# Patient Record
Sex: Female | Born: 1950 | Race: White | Hispanic: No | Marital: Married | Smoking: Current every day smoker
Health system: Southern US, Community
[De-identification: ages and names within clinical notes are randomized; demographics above are authoritative.]

## PROBLEM LIST (undated history)

## (undated) DIAGNOSIS — F329 Major depressive disorder, single episode, unspecified: Secondary | ICD-10-CM

## (undated) DIAGNOSIS — G709 Myoneural disorder, unspecified: Secondary | ICD-10-CM

## (undated) DIAGNOSIS — K219 Gastro-esophageal reflux disease without esophagitis: Secondary | ICD-10-CM

## (undated) DIAGNOSIS — R32 Unspecified urinary incontinence: Secondary | ICD-10-CM

## (undated) DIAGNOSIS — M1990 Unspecified osteoarthritis, unspecified site: Secondary | ICD-10-CM

## (undated) DIAGNOSIS — F419 Anxiety disorder, unspecified: Secondary | ICD-10-CM

## (undated) DIAGNOSIS — J449 Chronic obstructive pulmonary disease, unspecified: Secondary | ICD-10-CM

## (undated) DIAGNOSIS — K529 Noninfective gastroenteritis and colitis, unspecified: Secondary | ICD-10-CM

## (undated) DIAGNOSIS — F32A Depression, unspecified: Secondary | ICD-10-CM

## (undated) HISTORY — PX: FRACTURE SURGERY: SHX138

## (undated) HISTORY — PX: HERNIA REPAIR: SHX51

## (undated) HISTORY — PX: BACK SURGERY: SHX140

## (undated) HISTORY — PX: DIAGNOSTIC LAPAROSCOPY: SUR761

---

## 1971-01-04 HISTORY — PX: APPENDECTOMY: SHX54

## 1988-09-03 HISTORY — PX: KNEE ARTHROSCOPY: SUR90

## 1993-01-03 HISTORY — PX: KNEE SURGERY: SHX244

## 1998-01-03 HISTORY — PX: LAPAROSCOPY: SHX197

## 1999-08-04 HISTORY — PX: BACK SURGERY: SHX140

## 2001-06-03 HISTORY — PX: HEMORROIDECTOMY: SUR656

## 2001-06-03 HISTORY — PX: ABDOMINAL HYSTERECTOMY: SHX81

## 2004-09-03 HISTORY — PX: BACK SURGERY: SHX140

## 2010-05-04 HISTORY — PX: THYROID LOBECTOMY: SHX420

## 2010-05-04 HISTORY — PX: PARATHYROIDECTOMY: SHX19

## 2010-06-04 DIAGNOSIS — K529 Noninfective gastroenteritis and colitis, unspecified: Secondary | ICD-10-CM

## 2010-06-04 HISTORY — DX: Noninfective gastroenteritis and colitis, unspecified: K52.9

## 2014-03-13 ENCOUNTER — Other Ambulatory Visit: Payer: Self-pay | Admitting: Neurosurgery

## 2014-03-13 DIAGNOSIS — M48061 Spinal stenosis, lumbar region without neurogenic claudication: Secondary | ICD-10-CM

## 2014-03-18 ENCOUNTER — Ambulatory Visit
Admission: RE | Admit: 2014-03-18 | Discharge: 2014-03-18 | Disposition: A | Discharge status: Home / Self Care | Payer: Medicare HMO | Source: Ambulatory Visit | Attending: Neurosurgery | Admitting: Neurosurgery

## 2014-03-18 DIAGNOSIS — M48061 Spinal stenosis, lumbar region without neurogenic claudication: Secondary | ICD-10-CM

## 2014-03-18 MED ORDER — METHYLPREDNISOLONE ACETATE 40 MG/ML INJ SUSP (RADIOLOG
120.0000 mg | Freq: Once | INTRAMUSCULAR | Status: AC
  Administered 2014-03-18: 120 mg via EPIDURAL

## 2014-03-18 MED ORDER — IOHEXOL 180 MG/ML  SOLN
1.0000 mL | Freq: Once | INTRAMUSCULAR | Status: AC | PRN
  Administered 2014-03-18: 1 mL via EPIDURAL

## 2014-03-18 MED ORDER — DIAZEPAM 5 MG PO TABS
10.0000 mg | ORAL_TABLET | Freq: Once | ORAL | Status: AC
  Administered 2014-03-18: 10 mg via ORAL

## 2014-03-18 NOTE — Discharge Instructions (Signed)

## 2014-04-01 ENCOUNTER — Other Ambulatory Visit: Payer: Self-pay | Admitting: Neurosurgery

## 2014-04-01 DIAGNOSIS — M48061 Spinal stenosis, lumbar region without neurogenic claudication: Secondary | ICD-10-CM

## 2014-04-15 ENCOUNTER — Other Ambulatory Visit: Payer: Medicare HMO

## 2014-04-17 ENCOUNTER — Inpatient Hospital Stay: Admission: RE | Admit: 2014-04-17 | Payer: Medicare HMO | Source: Ambulatory Visit

## 2014-04-17 ENCOUNTER — Ambulatory Visit
Admission: RE | Admit: 2014-04-17 | Discharge: 2014-04-17 | Disposition: A | Discharge status: Home / Self Care | Payer: Medicare HMO | Source: Ambulatory Visit | Attending: Neurosurgery | Admitting: Neurosurgery

## 2014-04-17 DIAGNOSIS — M48061 Spinal stenosis, lumbar region without neurogenic claudication: Secondary | ICD-10-CM

## 2014-04-17 HISTORY — DX: Noninfective gastroenteritis and colitis, unspecified: K52.9

## 2014-04-17 MED ORDER — METHYLPREDNISOLONE ACETATE 40 MG/ML INJ SUSP (RADIOLOG
120.0000 mg | Freq: Once | INTRAMUSCULAR | Status: AC
  Administered 2014-04-17: 120 mg via EPIDURAL

## 2014-04-17 MED ORDER — IOHEXOL 180 MG/ML  SOLN
1.0000 mL | Freq: Once | INTRAMUSCULAR | Status: AC | PRN
  Administered 2014-04-17: 1 mL via EPIDURAL

## 2014-04-17 MED ORDER — DIAZEPAM 5 MG PO TABS
10.0000 mg | ORAL_TABLET | Freq: Once | ORAL | Status: AC
  Administered 2014-04-17: 10 mg via ORAL

## 2014-04-17 NOTE — Discharge Instructions (Signed)

## 2014-05-05 ENCOUNTER — Other Ambulatory Visit: Payer: Self-pay | Admitting: Neurosurgery

## 2014-05-05 DIAGNOSIS — M545 Low back pain, unspecified: Secondary | ICD-10-CM

## 2014-05-05 DIAGNOSIS — G8929 Other chronic pain: Secondary | ICD-10-CM

## 2014-05-14 ENCOUNTER — Ambulatory Visit
Admission: RE | Admit: 2014-05-14 | Discharge: 2014-05-14 | Disposition: A | Discharge status: Home / Self Care | Payer: Medicare HMO | Source: Ambulatory Visit | Attending: Neurosurgery | Admitting: Neurosurgery

## 2014-05-14 DIAGNOSIS — M545 Low back pain, unspecified: Secondary | ICD-10-CM

## 2014-05-14 DIAGNOSIS — G8929 Other chronic pain: Secondary | ICD-10-CM

## 2014-05-14 MED ORDER — DIAZEPAM 5 MG PO TABS
10.0000 mg | ORAL_TABLET | Freq: Once | ORAL | Status: AC
  Administered 2014-05-14: 10 mg via ORAL

## 2014-05-14 MED ORDER — METHYLPREDNISOLONE ACETATE 40 MG/ML INJ SUSP (RADIOLOG
120.0000 mg | Freq: Once | INTRAMUSCULAR | Status: AC
  Administered 2014-05-14: 120 mg via EPIDURAL

## 2014-05-14 MED ORDER — IOHEXOL 180 MG/ML  SOLN
1.0000 mL | Freq: Once | INTRAMUSCULAR | Status: AC | PRN
  Administered 2014-05-14: 1 mL via EPIDURAL

## 2014-05-14 NOTE — Discharge Instructions (Signed)

## 2014-07-02 DIAGNOSIS — R635 Abnormal weight gain: Secondary | ICD-10-CM | POA: Insufficient documentation

## 2014-07-02 DIAGNOSIS — E213 Hyperparathyroidism, unspecified: Secondary | ICD-10-CM | POA: Insufficient documentation

## 2014-07-02 DIAGNOSIS — Z720 Tobacco use: Secondary | ICD-10-CM | POA: Insufficient documentation

## 2014-07-02 DIAGNOSIS — E041 Nontoxic single thyroid nodule: Secondary | ICD-10-CM | POA: Insufficient documentation

## 2014-07-02 DIAGNOSIS — D8989 Other specified disorders involving the immune mechanism, not elsewhere classified: Secondary | ICD-10-CM | POA: Insufficient documentation

## 2014-07-02 DIAGNOSIS — R5382 Chronic fatigue, unspecified: Secondary | ICD-10-CM

## 2014-07-02 DIAGNOSIS — G9332 Myalgic encephalomyelitis/chronic fatigue syndrome: Secondary | ICD-10-CM | POA: Insufficient documentation

## 2014-08-11 ENCOUNTER — Other Ambulatory Visit: Payer: Self-pay | Admitting: Neurosurgery

## 2014-08-11 DIAGNOSIS — M48061 Spinal stenosis, lumbar region without neurogenic claudication: Secondary | ICD-10-CM

## 2014-08-22 ENCOUNTER — Ambulatory Visit
Admission: RE | Admit: 2014-08-22 | Discharge: 2014-08-22 | Disposition: A | Discharge status: Home / Self Care | Payer: Medicare HMO | Source: Ambulatory Visit | Attending: Neurosurgery | Admitting: Neurosurgery

## 2014-08-22 DIAGNOSIS — M48061 Spinal stenosis, lumbar region without neurogenic claudication: Secondary | ICD-10-CM

## 2014-08-22 MED ORDER — IOHEXOL 180 MG/ML  SOLN
15.0000 mL | Freq: Once | INTRAMUSCULAR | Status: DC | PRN
  Administered 2014-08-22: 15 mL via INTRATHECAL

## 2014-08-22 MED ORDER — DIAZEPAM 5 MG PO TABS
5.0000 mg | ORAL_TABLET | Freq: Once | ORAL | Status: AC
  Administered 2014-08-22: 5 mg via ORAL

## 2014-08-22 MED ORDER — MEPERIDINE HCL 100 MG/ML IJ SOLN
75.0000 mg | Freq: Once | INTRAMUSCULAR | Status: AC
  Administered 2014-08-22: 75 mg via INTRAMUSCULAR

## 2014-08-22 MED ORDER — OXYCODONE HCL 5 MG PO TABS
10.0000 mg | ORAL_TABLET | Freq: Once | ORAL | Status: AC
  Administered 2014-08-22: 10 mg via ORAL

## 2014-08-22 MED ORDER — ONDANSETRON HCL 4 MG/2ML IJ SOLN
4.0000 mg | Freq: Once | INTRAMUSCULAR | Status: AC
  Administered 2014-08-22: 4 mg via INTRAMUSCULAR

## 2014-08-22 NOTE — Progress Notes (Signed)
Pt states she has been off Phentermine and Promethazine for the past week. Discharge instructions explained to pt.

## 2014-08-22 NOTE — Discharge Instructions (Signed)
Myelogram Discharge Instructions  1. Go home and rest quietly for the next 24 hours.  It is important to lie flat for the next 24 hours.  Get up only to go to the restroom.  You may lie in the bed or on a couch on your back, your stomach, your left side or your right side.  You may have one pillow under your head.  You may have pillows between your knees while you are on your side or under your knees while you are on your back.  2. DO NOT drive today.  Recline the seat as far back as it will go, while still wearing your seat belt, on the way home.  3. You may get up to go to the bathroom as needed.  You may sit up for 10 minutes to eat.  You may resume your normal diet and medications unless otherwise indicated.  Drink lots of extra fluids today and tomorrow.  4. The incidence of headache, nausea, or vomiting is about 5% (one in 20 patients).  If you develop a headache, lie flat and drink plenty of fluids until the headache goes away.  Caffeinated beverages may be helpful.  If you develop severe nausea and vomiting or a headache that does not go away with flat bed rest, call [phone].  5. You may resume normal activities after your 24 hours of bed rest is over; however, do not exert yourself strongly or do any heavy lifting tomorrow. If when you get up you have a headache when standing, go back to bed and force fluids for another 24 hours.  6. Call your physician for a follow-up appointment.  The results of your myelogram will be sent directly to your physician by the following day.  7. If you have any questions or if complications develop after you arrive home, please call [phone].  Discharge instructions have been explained to the patient.  The patient, or the person responsible for the patient, fully understands these instructions.      May resume Phentermine and Promethazine on Aug. 20, 2016, after 11:00 am.

## 2014-09-01 ENCOUNTER — Telehealth: Payer: Self-pay

## 2014-09-01 NOTE — Telephone Encounter (Signed)
Patient called wanting to speak with Dr. Benard Rink in follow-up to lumbar myelogram here 08/22/14.  I told her Dr. Benard Rink wasn't in this office today and that Dr. Gerlene Fee really is the physician to speak with her about the results and treatment options.  She says her appointment with Dr. Gerlene Fee isn't until 09/10/14, and that she's hurting and wants to know what to do and what the results are of the myelogram.  I reiterated several times that she needed to get this information from Dr. Gerlene Fee; that Dr. Benard Rink isn't in a position to go over the myelogram results and discuss treatment options.  Donell Sievert, RN

## 2014-09-10 ENCOUNTER — Other Ambulatory Visit: Payer: Self-pay | Admitting: Neurosurgery

## 2014-09-25 ENCOUNTER — Ambulatory Visit (HOSPITAL_COMMUNITY)
Admission: RE | Admit: 2014-09-25 | Discharge: 2014-09-25 | Disposition: A | Discharge status: Home / Self Care | Payer: Medicare HMO | Source: Ambulatory Visit | Attending: Neurosurgery | Admitting: Neurosurgery

## 2014-09-25 ENCOUNTER — Encounter (HOSPITAL_COMMUNITY)
Admission: RE | Admit: 2014-09-25 | Discharge: 2014-09-25 | Disposition: A | Discharge status: Home / Self Care | Payer: Medicare HMO | Source: Ambulatory Visit | Attending: Neurosurgery | Admitting: Neurosurgery

## 2014-09-25 ENCOUNTER — Encounter (HOSPITAL_COMMUNITY): Payer: Self-pay

## 2014-09-25 VITALS — BP 107/58 | HR 70 | Temp 98.4°F | Resp 20 | Ht 66.5 in | Wt 160.2 lb

## 2014-09-25 DIAGNOSIS — Z01818 Encounter for other preprocedural examination: Secondary | ICD-10-CM | POA: Insufficient documentation

## 2014-09-25 DIAGNOSIS — R509 Fever, unspecified: Secondary | ICD-10-CM | POA: Diagnosis not present

## 2014-09-25 DIAGNOSIS — R05 Cough: Secondary | ICD-10-CM | POA: Insufficient documentation

## 2014-09-25 DIAGNOSIS — F22 Delusional disorders: Secondary | ICD-10-CM

## 2014-09-25 DIAGNOSIS — J439 Emphysema, unspecified: Secondary | ICD-10-CM | POA: Insufficient documentation

## 2014-09-25 HISTORY — DX: Depression, unspecified: F32.A

## 2014-09-25 HISTORY — DX: Gastro-esophageal reflux disease without esophagitis: K21.9

## 2014-09-25 HISTORY — DX: Myoneural disorder, unspecified: G70.9

## 2014-09-25 HISTORY — DX: Unspecified urinary incontinence: R32

## 2014-09-25 HISTORY — DX: Anxiety disorder, unspecified: F41.9

## 2014-09-25 HISTORY — DX: Unspecified osteoarthritis, unspecified site: M19.90

## 2014-09-25 HISTORY — DX: Major depressive disorder, single episode, unspecified: F32.9

## 2014-09-25 HISTORY — DX: Chronic obstructive pulmonary disease, unspecified: J44.9

## 2014-09-25 LAB — TYPE AND SCREEN
ABO/RH(D): B POS
ANTIBODY SCREEN: NEGATIVE

## 2014-09-25 LAB — BASIC METABOLIC PANEL
ANION GAP: 8 (ref 5–15)
BUN: 12 mg/dL (ref 6–20)
CHLORIDE: 105 mmol/L (ref 101–111)
CO2: 27 mmol/L (ref 22–32)
Calcium: 9.4 mg/dL (ref 8.9–10.3)
Creatinine, Ser: 0.68 mg/dL (ref 0.44–1.00)
GFR calc Af Amer: >60 mL/min (ref >60)
GLUCOSE: 98 mg/dL (ref 65–99)
POTASSIUM: 3.9 mmol/L (ref 3.5–5.1)
Sodium: 140 mmol/L (ref 135–145)

## 2014-09-25 LAB — CBC
HEMATOCRIT: 44.9 % (ref 36.0–46.0)
HEMOGLOBIN: 14.8 g/dL (ref 12.0–15.0)
MCH: 30.6 pg (ref 26.0–34.0)
MCHC: 33 g/dL (ref 30.0–36.0)
MCV: 93 fL (ref 78.0–100.0)
Platelets: 272 10*3/uL (ref 150–400)
RBC: 4.83 MIL/uL (ref 3.87–5.11)
RDW: 14.2 % (ref 11.5–15.5)
WBC: 8 10*3/uL (ref 4.0–10.5)

## 2014-09-25 LAB — SURGICAL PCR SCREEN
MRSA, PCR: NEGATIVE
STAPHYLOCOCCUS AUREUS: NEGATIVE

## 2014-09-25 LAB — ABO/RH: ABO/RH(D): B POS

## 2014-10-06 MED ORDER — VANCOMYCIN HCL IN DEXTROSE 1-5 GM/200ML-% IV SOLN
1000.0000 mg | INTRAVENOUS | Status: AC
  Administered 2014-10-07: 1000 mg via INTRAVENOUS
  Filled 2014-10-06: qty 200

## 2014-10-07 ENCOUNTER — Encounter (HOSPITAL_COMMUNITY): Payer: Self-pay | Admitting: *Deleted

## 2014-10-07 ENCOUNTER — Inpatient Hospital Stay (HOSPITAL_COMMUNITY)
Admission: RE | Admit: 2014-10-07 | Discharge: 2014-10-09 | DRG: 460 | Disposition: A | Discharge status: Home / Self Care | Payer: Medicare HMO | Source: Ambulatory Visit | Attending: Neurosurgery | Admitting: Neurosurgery

## 2014-10-07 ENCOUNTER — Inpatient Hospital Stay (HOSPITAL_COMMUNITY): Payer: Medicare HMO | Admitting: Anesthesiology

## 2014-10-07 ENCOUNTER — Encounter (HOSPITAL_COMMUNITY)
Admission: RE | Disposition: A | Discharge status: Home / Self Care | Payer: Self-pay | Source: Ambulatory Visit | Attending: Neurosurgery

## 2014-10-07 ENCOUNTER — Inpatient Hospital Stay (HOSPITAL_COMMUNITY): Payer: Medicare HMO

## 2014-10-07 DIAGNOSIS — Z981 Arthrodesis status: Secondary | ICD-10-CM

## 2014-10-07 DIAGNOSIS — Z79899 Other long term (current) drug therapy: Secondary | ICD-10-CM | POA: Diagnosis not present

## 2014-10-07 DIAGNOSIS — Z888 Allergy status to other drugs, medicaments and biological substances status: Secondary | ICD-10-CM

## 2014-10-07 DIAGNOSIS — F17200 Nicotine dependence, unspecified, uncomplicated: Secondary | ICD-10-CM | POA: Diagnosis present

## 2014-10-07 DIAGNOSIS — M545 Low back pain: Secondary | ICD-10-CM | POA: Diagnosis present

## 2014-10-07 DIAGNOSIS — J449 Chronic obstructive pulmonary disease, unspecified: Secondary | ICD-10-CM | POA: Diagnosis present

## 2014-10-07 DIAGNOSIS — M4806 Spinal stenosis, lumbar region: Principal | ICD-10-CM | POA: Diagnosis present

## 2014-10-07 DIAGNOSIS — K219 Gastro-esophageal reflux disease without esophagitis: Secondary | ICD-10-CM | POA: Diagnosis present

## 2014-10-07 DIAGNOSIS — F419 Anxiety disorder, unspecified: Secondary | ICD-10-CM | POA: Diagnosis present

## 2014-10-07 DIAGNOSIS — Z79891 Long term (current) use of opiate analgesic: Secondary | ICD-10-CM | POA: Diagnosis not present

## 2014-10-07 DIAGNOSIS — M48062 Spinal stenosis, lumbar region with neurogenic claudication: Secondary | ICD-10-CM | POA: Diagnosis present

## 2014-10-07 DIAGNOSIS — Z419 Encounter for procedure for purposes other than remedying health state, unspecified: Secondary | ICD-10-CM

## 2014-10-07 DIAGNOSIS — Z881 Allergy status to other antibiotic agents status: Secondary | ICD-10-CM | POA: Diagnosis not present

## 2014-10-07 DIAGNOSIS — F329 Major depressive disorder, single episode, unspecified: Secondary | ICD-10-CM | POA: Diagnosis present

## 2014-10-07 HISTORY — PX: ANTERIOR LATERAL LUMBAR FUSION WITH PERCUTANEOUS SCREW 1 LEVEL: SHX5553

## 2014-10-07 HISTORY — PX: LUMBAR PERCUTANEOUS PEDICLE SCREW 1 LEVEL: SHX5560

## 2014-10-07 SURGERY — ANTERIOR LATERAL LUMBAR FUSION WITH PERCUTANEOUS SCREW 1 LEVEL
Anesthesia: General

## 2014-10-07 MED ORDER — HYDROMORPHONE HCL 1 MG/ML IJ SOLN
0.2500 mg | INTRAMUSCULAR | Status: DC | PRN
  Administered 2014-10-07 (×8): 0.25 mg via INTRAVENOUS

## 2014-10-07 MED ORDER — SODIUM CHLORIDE 0.9 % IV SOLN: 250.0000 mL | INTRAVENOUS | Status: DC

## 2014-10-07 MED ORDER — FENTANYL CITRATE (PF) 250 MCG/5ML IJ SOLN
INTRAMUSCULAR | Status: AC
  Filled 2014-10-07: qty 5

## 2014-10-07 MED ORDER — HYDROMORPHONE HCL 1 MG/ML IJ SOLN
1.0000 mg | INTRAMUSCULAR | Status: DC | PRN
  Administered 2014-10-07 – 2014-10-09 (×9): 1 mg via INTRAMUSCULAR
  Filled 2014-10-07 (×9): qty 1

## 2014-10-07 MED ORDER — SODIUM CHLORIDE 0.9 % IJ SOLN: 3.0000 mL | INTRAMUSCULAR | Status: DC | PRN

## 2014-10-07 MED ORDER — FENTANYL CITRATE (PF) 100 MCG/2ML IJ SOLN
INTRAMUSCULAR | Status: AC
  Filled 2014-10-07: qty 2

## 2014-10-07 MED ORDER — LIDOCAINE HCL (CARDIAC) 20 MG/ML IV SOLN
INTRAVENOUS | Status: AC
  Filled 2014-10-07: qty 5

## 2014-10-07 MED ORDER — METHOCARBAMOL 500 MG PO TABS
500.0000 mg | ORAL_TABLET | Freq: Four times a day (QID) | ORAL | Status: DC | PRN
  Administered 2014-10-08 (×3): 500 mg via ORAL
  Filled 2014-10-07 (×3): qty 1

## 2014-10-07 MED ORDER — THROMBIN 20000 UNITS EX SOLR
CUTANEOUS | Status: DC | PRN
  Administered 2014-10-07: 12:00:00 via TOPICAL

## 2014-10-07 MED ORDER — BISACODYL 5 MG PO TBEC: 5.0000 mg | DELAYED_RELEASE_TABLET | Freq: Every day | ORAL | Status: DC | PRN

## 2014-10-07 MED ORDER — ROCURONIUM BROMIDE 100 MG/10ML IV SOLN
INTRAVENOUS | Status: DC | PRN
  Administered 2014-10-07: 30 mg via INTRAVENOUS

## 2014-10-07 MED ORDER — ACETAMINOPHEN 325 MG PO TABS: 650.0000 mg | ORAL_TABLET | ORAL | Status: DC | PRN

## 2014-10-07 MED ORDER — ONDANSETRON HCL 4 MG/2ML IJ SOLN: 4.0000 mg | INTRAMUSCULAR | Status: DC | PRN

## 2014-10-07 MED ORDER — GLYCOPYRROLATE 0.2 MG/ML IJ SOLN
INTRAMUSCULAR | Status: AC
  Filled 2014-10-07: qty 1

## 2014-10-07 MED ORDER — METHOCARBAMOL 1000 MG/10ML IJ SOLN
500.0000 mg | Freq: Four times a day (QID) | INTRAVENOUS | Status: DC | PRN
  Filled 2014-10-07: qty 5

## 2014-10-07 MED ORDER — HYDROMORPHONE HCL 1 MG/ML IJ SOLN
INTRAMUSCULAR | Status: AC
  Filled 2014-10-07: qty 1

## 2014-10-07 MED ORDER — PHENOL 1.4 % MT LIQD: 1.0000 | OROMUCOSAL | Status: DC | PRN

## 2014-10-07 MED ORDER — MIDAZOLAM HCL 5 MG/5ML IJ SOLN
INTRAMUSCULAR | Status: DC | PRN
  Administered 2014-10-07: 1 mg via INTRAVENOUS

## 2014-10-07 MED ORDER — LIDOCAINE HCL 4 % MT SOLN
OROMUCOSAL | Status: DC | PRN
  Administered 2014-10-07: 2 mL via TOPICAL

## 2014-10-07 MED ORDER — PROPOFOL 500 MG/50ML IV EMUL
INTRAVENOUS | Status: DC | PRN
  Administered 2014-10-07: 25 ug/kg/min via INTRAVENOUS

## 2014-10-07 MED ORDER — DOCUSATE SODIUM 100 MG PO CAPS
100.0000 mg | ORAL_CAPSULE | Freq: Two times a day (BID) | ORAL | Status: DC
  Administered 2014-10-07 – 2014-10-09 (×4): 100 mg via ORAL
  Filled 2014-10-07 (×4): qty 1

## 2014-10-07 MED ORDER — LIDOCAINE HCL (CARDIAC) 20 MG/ML IV SOLN
INTRAVENOUS | Status: DC | PRN
  Administered 2014-10-07: 40 mg via INTRAVENOUS

## 2014-10-07 MED ORDER — FUROSEMIDE 40 MG PO TABS: 40.0000 mg | ORAL_TABLET | Freq: Every day | ORAL | Status: DC | PRN

## 2014-10-07 MED ORDER — LORAZEPAM 1 MG PO TABS
3.0000 mg | ORAL_TABLET | Freq: Every day | ORAL | Status: DC
  Administered 2014-10-07 – 2014-10-08 (×2): 3 mg via ORAL
  Filled 2014-10-07 (×2): qty 3

## 2014-10-07 MED ORDER — NEOSTIGMINE METHYLSULFATE 10 MG/10ML IV SOLN
INTRAVENOUS | Status: DC | PRN
Start: 2014-10-07 — End: 2014-10-07
  Administered 2014-10-07: 3 mg via INTRAVENOUS

## 2014-10-07 MED ORDER — DEXAMETHASONE SODIUM PHOSPHATE 10 MG/ML IJ SOLN: 10.0000 mg | INTRAMUSCULAR | Status: DC

## 2014-10-07 MED ORDER — LORAZEPAM 1 MG PO TABS
1.0000 mg | ORAL_TABLET | Freq: Every day | ORAL | Status: DC
  Administered 2014-10-08 – 2014-10-09 (×2): 1 mg via ORAL
  Filled 2014-10-07 (×2): qty 1

## 2014-10-07 MED ORDER — KCL IN DEXTROSE-NACL 20-5-0.45 MEQ/L-%-% IV SOLN
80.0000 mL/h | INTRAVENOUS | Status: DC
  Administered 2014-10-07 – 2014-10-08 (×3): 80 mL/h via INTRAVENOUS
  Filled 2014-10-07 (×7): qty 1000

## 2014-10-07 MED ORDER — ONDANSETRON HCL 4 MG/2ML IJ SOLN
INTRAMUSCULAR | Status: DC | PRN
  Administered 2014-10-07: 4 mg via INTRAVENOUS

## 2014-10-07 MED ORDER — BUPIVACAINE HCL (PF) 0.25 % IJ SOLN
INTRAMUSCULAR | Status: DC | PRN
  Administered 2014-10-07: 27 mL

## 2014-10-07 MED ORDER — LACTATED RINGERS IV SOLN: INTRAVENOUS | Status: DC

## 2014-10-07 MED ORDER — GLYCOPYRROLATE 0.2 MG/ML IJ SOLN
INTRAMUSCULAR | Status: DC | PRN
  Administered 2014-10-07: 0.4 mg via INTRAVENOUS
  Administered 2014-10-07: 0.2 mg via INTRAVENOUS

## 2014-10-07 MED ORDER — FENTANYL CITRATE (PF) 100 MCG/2ML IJ SOLN
INTRAMUSCULAR | Status: DC | PRN
  Administered 2014-10-07: 50 ug via INTRAVENOUS
  Administered 2014-10-07 (×2): 100 ug via INTRAVENOUS
  Administered 2014-10-07 (×2): 50 ug via INTRAVENOUS

## 2014-10-07 MED ORDER — SUCCINYLCHOLINE CHLORIDE 20 MG/ML IJ SOLN
INTRAMUSCULAR | Status: AC
  Filled 2014-10-07: qty 1

## 2014-10-07 MED ORDER — PANTOPRAZOLE SODIUM 40 MG IV SOLR
40.0000 mg | Freq: Every day | INTRAVENOUS | Status: DC
  Administered 2014-10-07: 40 mg via INTRAVENOUS
  Filled 2014-10-07: qty 40

## 2014-10-07 MED ORDER — PROPOFOL 10 MG/ML IV BOLUS
INTRAVENOUS | Status: AC
  Filled 2014-10-07: qty 20

## 2014-10-07 MED ORDER — ACETAMINOPHEN 650 MG RE SUPP: 650.0000 mg | RECTAL | Status: DC | PRN

## 2014-10-07 MED ORDER — NEOSTIGMINE METHYLSULFATE 10 MG/10ML IV SOLN
INTRAVENOUS | Status: AC
  Filled 2014-10-07: qty 1

## 2014-10-07 MED ORDER — ONDANSETRON HCL 4 MG/2ML IJ SOLN: 4.0000 mg | Freq: Once | INTRAMUSCULAR | Status: DC | PRN

## 2014-10-07 MED ORDER — PHENYLEPHRINE HCL 10 MG/ML IJ SOLN
10.0000 mg | INTRAVENOUS | Status: DC | PRN
  Administered 2014-10-07: 10 ug/min via INTRAVENOUS

## 2014-10-07 MED ORDER — LORAZEPAM 1 MG PO TABS: 1.0000 mg | ORAL_TABLET | Freq: Two times a day (BID) | ORAL | Status: DC

## 2014-10-07 MED ORDER — MENTHOL 3 MG MT LOZG: 1.0000 | LOZENGE | OROMUCOSAL | Status: DC | PRN

## 2014-10-07 MED ORDER — SUCCINYLCHOLINE CHLORIDE 20 MG/ML IJ SOLN
INTRAMUSCULAR | Status: DC | PRN
  Administered 2014-10-07: 100 mg via INTRAVENOUS

## 2014-10-07 MED ORDER — SODIUM CHLORIDE 0.9 % IR SOLN
Status: DC | PRN
  Administered 2014-10-07: 12:00:00

## 2014-10-07 MED ORDER — LACTATED RINGERS IV SOLN
INTRAVENOUS | Status: DC | PRN
  Administered 2014-10-07 (×3): via INTRAVENOUS

## 2014-10-07 MED ORDER — DEXAMETHASONE SODIUM PHOSPHATE 10 MG/ML IJ SOLN
INTRAMUSCULAR | Status: AC
  Filled 2014-10-07: qty 1

## 2014-10-07 MED ORDER — MIDAZOLAM HCL 2 MG/2ML IJ SOLN
INTRAMUSCULAR | Status: AC
  Filled 2014-10-07: qty 4

## 2014-10-07 MED ORDER — SODIUM CHLORIDE 0.9 % IJ SOLN
3.0000 mL | Freq: Two times a day (BID) | INTRAMUSCULAR | Status: DC
  Administered 2014-10-08: 3 mL via INTRAVENOUS

## 2014-10-07 MED ORDER — OXYCODONE-ACETAMINOPHEN 5-325 MG PO TABS
1.0000 | ORAL_TABLET | ORAL | Status: DC | PRN
  Administered 2014-10-08 – 2014-10-09 (×9): 2 via ORAL
  Filled 2014-10-07 (×9): qty 2

## 2014-10-07 MED ORDER — ALBUTEROL SULFATE HFA 108 (90 BASE) MCG/ACT IN AERS
INHALATION_SPRAY | RESPIRATORY_TRACT | Status: DC | PRN
  Administered 2014-10-07: 2 via RESPIRATORY_TRACT

## 2014-10-07 MED ORDER — FENTANYL CITRATE (PF) 100 MCG/2ML IJ SOLN
25.0000 ug | INTRAMUSCULAR | Status: DC | PRN
  Administered 2014-10-07 (×3): 50 ug via INTRAVENOUS

## 2014-10-07 MED ORDER — 0.9 % SODIUM CHLORIDE (POUR BTL) OPTIME
TOPICAL | Status: DC | PRN
  Administered 2014-10-07: 1000 mL

## 2014-10-07 MED ORDER — VANCOMYCIN HCL IN DEXTROSE 1-5 GM/200ML-% IV SOLN
1000.0000 mg | Freq: Once | INTRAVENOUS | Status: AC
  Administered 2014-10-07: 1000 mg via INTRAVENOUS
  Filled 2014-10-07: qty 200

## 2014-10-07 MED ORDER — PROPOFOL 10 MG/ML IV BOLUS
INTRAVENOUS | Status: DC | PRN
  Administered 2014-10-07: 150 mg via INTRAVENOUS

## 2014-10-07 SURGICAL SUPPLY — 69 items
BAG DECANTER FOR FLEXI CONT (MISCELLANEOUS) ×4 IMPLANT
BENZOIN TINCTURE PRP APPL 2/3 (GAUZE/BANDAGES/DRESSINGS) ×12 IMPLANT
BLADE CLIPPER SURG (BLADE) IMPLANT
BONE EQUIVA 10CC (Bone Implant) ×4 IMPLANT
BRUSH SCRUB EZ PLAIN DRY (MISCELLANEOUS) ×8 IMPLANT
CLOSURE WOUND 1/2 X4 (GAUZE/BANDAGES/DRESSINGS) ×2
CONT SPEC 4OZ CLIKSEAL STRL BL (MISCELLANEOUS) ×4 IMPLANT
COVER BACK TABLE 24X17X13 BIG (DRAPES) ×8 IMPLANT
COVER BACK TABLE 60X90IN (DRAPES) IMPLANT
DRAPE C-ARM 42X72 X-RAY (DRAPES) ×12 IMPLANT
DRAPE C-ARMOR (DRAPES) ×8 IMPLANT
DRAPE LAPAROTOMY 100X72X124 (DRAPES) ×12 IMPLANT
DRAPE SURG 17X23 STRL (DRAPES) ×24 IMPLANT
DRSG OPSITE POSTOP 3X4 (GAUZE/BANDAGES/DRESSINGS) ×4 IMPLANT
DRSG OPSITE POSTOP 4X6 (GAUZE/BANDAGES/DRESSINGS) ×4 IMPLANT
DURAPREP 26ML APPLICATOR (WOUND CARE) ×12 IMPLANT
ELECT BLADE 4.0 EZ CLEAN MEGAD (MISCELLANEOUS)
ELECT REM PT RETURN 9FT ADLT (ELECTROSURGICAL) ×8
ELECTRODE BLDE 4.0 EZ CLN MEGD (MISCELLANEOUS) IMPLANT
ELECTRODE REM PT RTRN 9FT ADLT (ELECTROSURGICAL) ×4 IMPLANT
EVACUATOR 1/8 PVC DRAIN (DRAIN) IMPLANT
FEE INTRAOP MONITOR IMPULS NCS (MISCELLANEOUS) ×2 IMPLANT
GAUZE SPONGE 4X4 12PLY STRL (GAUZE/BANDAGES/DRESSINGS) IMPLANT
GAUZE SPONGE 4X4 16PLY XRAY LF (GAUZE/BANDAGES/DRESSINGS) IMPLANT
GLOVE BIO SURGEON STRL SZ8 (GLOVE) IMPLANT
GLOVE ECLIPSE 8.0 STRL XLNG CF (GLOVE) ×12 IMPLANT
GLOVE EXAM NITRILE LRG STRL (GLOVE) IMPLANT
GLOVE EXAM NITRILE XL STR (GLOVE) IMPLANT
GLOVE EXAM NITRILE XS STR PU (GLOVE) IMPLANT
GLOVE INDICATOR 7.0 STRL GRN (GLOVE) ×8 IMPLANT
GLOVE INDICATOR 7.5 STRL GRN (GLOVE) ×8 IMPLANT
GLOVE SURG SS PI 7.0 STRL IVOR (GLOVE) ×16 IMPLANT
GOWN STRL REUS W/ TWL LRG LVL3 (GOWN DISPOSABLE) ×2 IMPLANT
GOWN STRL REUS W/ TWL XL LVL3 (GOWN DISPOSABLE) ×8 IMPLANT
GOWN STRL REUS W/TWL 2XL LVL3 (GOWN DISPOSABLE) IMPLANT
GOWN STRL REUS W/TWL LRG LVL3 (GOWN DISPOSABLE) ×2
GOWN STRL REUS W/TWL XL LVL3 (GOWN DISPOSABLE) ×8
GUIDEWIRES (WIRE) ×4 IMPLANT
HANDLE PEDIGUARD CANNULATED (INSTRUMENTS) ×4 IMPLANT
INTRAOP MONITOR FEE IMPULS NCS (MISCELLANEOUS) ×2
INTRAOP MONITOR FEE IMPULSE (MISCELLANEOUS) ×2
K-WIRE NITHNOL TROCAR TIP (WIRE) ×16 IMPLANT
KIT ACCESS (KITS) ×4 IMPLANT
KIT BASIN OR (CUSTOM PROCEDURE TRAY) ×4 IMPLANT
KIT NEURO (KITS) ×4 IMPLANT
KIT ROOM TURNOVER OR (KITS) ×8 IMPLANT
LIQUID BAND (GAUZE/BANDAGES/DRESSINGS) ×8 IMPLANT
NEEDLE 1 PEDIGUARD CANNULATED (NEEDLE) ×8 IMPLANT
NEEDLE HYPO 22GX1.5 SAFETY (NEEDLE) ×4 IMPLANT
NEEDLE TARGETING (NEEDLE) ×8 IMPLANT
NS IRRIG 1000ML POUR BTL (IV SOLUTION) ×12 IMPLANT
PACK LAMINECTOMY NEURO (CUSTOM PROCEDURE TRAY) ×8 IMPLANT
ROD PATHFINDER PERC .45MM (Rod) ×8 IMPLANT
SCREW POLYAXIA MIS 6.5X40MM (Screw) ×16 IMPLANT
SHEATH PAT (SHEATH) ×4 IMPLANT
SPACER 18X45X10-0 (Spacer) ×4 IMPLANT
SPONGE LAP 4X18 X RAY DECT (DISPOSABLE) IMPLANT
SPONGE SURGIFOAM ABS GEL SZ50 (HEMOSTASIS) IMPLANT
STAPLER SKIN PROX WIDE 3.9 (STAPLE) ×4 IMPLANT
STRIP CLOSURE SKIN 1/2X4 (GAUZE/BANDAGES/DRESSINGS) ×6 IMPLANT
SUT VIC AB 2-0 OS6 18 (SUTURE) ×24 IMPLANT
SUT VIC AB 3-0 CP2 18 (SUTURE) ×8 IMPLANT
TAPE CLOTH 3X10 TAN LF (GAUZE/BANDAGES/DRESSINGS) ×8 IMPLANT
TOP CLSR SEQUOIA (Orthopedic Implant) ×16 IMPLANT
TOWEL OR 17X24 6PK STRL BLUE (TOWEL DISPOSABLE) ×8 IMPLANT
TOWEL OR 17X26 10 PK STRL BLUE (TOWEL DISPOSABLE) ×12 IMPLANT
TRAP SPECIMEN MUCOUS 40CC (MISCELLANEOUS) IMPLANT
TRAY FOLEY W/METER SILVER 14FR (SET/KITS/TRAYS/PACK) ×4 IMPLANT
WATER STERILE IRR 1000ML POUR (IV SOLUTION) ×4 IMPLANT

## 2014-10-07 NOTE — Transfer of Care (Signed)
Immediate Anesthesia Transfer of Care Note  Patient: Erin Riddle  Procedure(s) Performed: Procedure(s): Anterior Lateral Lumbar interbody Fusion  - Lumbar three-lumbar four- left side approach  (Left) LUMBAR PERCUTANEOUS PEDICLE SCREW Lumbar three-four (N/A)  Patient Location: PACU  Anesthesia Type:General  Level of Consciousness: awake, sedated and patient cooperative  Airway & Oxygen Therapy: Patient Spontanous Breathing and Patient connected to face mask oxygen  Post-op Assessment: Report given to RN, Post -op Vital signs reviewed and stable and Patient moving all extremities  Post vital signs: Reviewed and stable  Last Vitals:  Filed Vitals:   10/07/14 0934  BP: 100/58  Pulse: 67  Temp: 36.2 C  Resp: 18    Complications: No apparent anesthesia complications

## 2014-10-07 NOTE — Anesthesia Procedure Notes (Signed)
Procedure Name: Intubation Date/Time: 10/07/2014 10:56 AM Performed by: Izora Gala Pre-anesthesia Checklist: Patient identified, Emergency Drugs available, Suction available and Patient being monitored Patient Re-evaluated:Patient Re-evaluated prior to inductionOxygen Delivery Method: Circle system utilized Preoxygenation: Pre-oxygenation with 100% oxygen Intubation Type: IV induction Ventilation: Mask ventilation without difficulty Laryngoscope Size: Miller and 2 Grade View: Grade I Tube type: Oral Tube size: 7.0 mm Number of attempts: 1 Airway Equipment and Method: Stylet,  LTA kit utilized and Bite block Placement Confirmation: ETT inserted through vocal cords under direct vision,  positive ETCO2 and breath sounds checked- equal and bilateral Secured at: 22 cm Tube secured with: Tape Dental Injury: Teeth and Oropharynx as per pre-operative assessment

## 2014-10-07 NOTE — OR Nursing (Signed)
Needles for stim removed by stim representative at end of part one procedure

## 2014-10-07 NOTE — Progress Notes (Signed)
Utilization review completed.  

## 2014-10-07 NOTE — H&P (Signed)
Erin Riddle is an 64 y.o. female.   Chief Complaint: Low back pain into the legs HPI: The patient is a 64 year old female who is evaluated in the office for low back pain with relation the legs of many months duration. She 3 back surgeries the past by Dr. Elonda Husky eventually and up with rate cage fusions at L4-5 and L5-S1. This episode had no inciting event has been going on for 3 months. She saw her medical doctor and got some IM injections is been taken Zanaflex as well as MS Contin. An MRI scan was done she now comes for evaluation. She is that when she walks the pain goes down the anterior thighs and she also has a lot of back pain. After evaluation the office the patient underwent myelography. 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 It was felt that we could do an indirect decompression with a lateral fusion in place instrumentation percutaneously rather than do a direct decompression. The patient understood the plan agrees with this x-ray to proceed with a lateral fusion at L3-4 with pedicle screw instrumentation. I've had a long discussion with her regarding the risks and benefits of surgical intervention. The risks discussed include but are not limited to bleeding infection weakness numbness paralysis trouble with going through this so as muscle control with instrumentation nonunion coma and death. We have discussed alternative methods of therapy along with the risks and benefits of nonintervention. She's had the opportunity to ask numerous questions and appears to understand. With this information in hand she has requested that we proceed with surgery.  Past Medical History  Diagnosis Date  . Colitis 06/2010  . COPD (chronic obstructive pulmonary disease) (HCC)   . Depression   . Anxiety   . GERD (gastroesophageal reflux disease)     given Rx for Nexium- but has not started yet  . Incontinence in female     some leaking, "pt. reports its not severe but its  aggravating"  . Neuromuscular disorder (HCC)     stenosis   . Arthritis     shoulders, necks, low back    Past Surgical History  Procedure Laterality Date  . Appendectomy  1973  . Knee surgery Left 1995  . Diagnostic laparoscopy  early 1980's    endometriosis  . Laparoscopy  2000    fibroids  . Back surgery  05/1998,  . Back surgery  08/1999    L4-L5 fusion  . Back surgery  09/2004    bolts and screws removed, cage left in  . Abdominal hysterectomy  06/2001    and hemorrhoid surgery  . Hemorroidectomy  06/2001  . Hernia repair  05/2008, 08/2008  . Parathyroidectomy Right 05/2010  . Thyroid lobectomy  05/2010    "removed half thyroid and isthmus and one lymph node  . Fracture surgery Left 2004 & 2005    multiple surgeries on L arm for trauma  . Knee arthroscopy Left 1990's    History reviewed. No pertinent family history. Social History:  reports that she has been smoking.  She does not have any smokeless tobacco history on file. She reports that she drinks alcohol. She reports that she does not use illicit drugs.  Allergies:  Allergies  Allergen Reactions  . Alendronate Sodium Other (See Comments)    Unspecified  . Boniva [Ibandronic Acid] Other (See Comments)    Unspecified  . Ceclor [Cefaclor] Other (See Comments)    Unspecified  . Gabitril [Tiagabine Hcl] Other (See Comments)  Unspecified  . Provigil [Modafinil] Other (See Comments)    Unspecified  . Tiagabine Other (See Comments)    Other Reaction: Other reaction  . Amitriptyline Nausea And Vomiting  . Dilantin [Phenytoin Sodium Extended] Other (See Comments)    Dizziness   . Erythromycin Nausea And Vomiting  . Keppra [Levetiracetam] Other (See Comments)    Dizziness   . Lyrica [Pregabalin] Other (See Comments)    Double vision  . Neurontin [Gabapentin] Swelling and Other (See Comments)    Gain weight   . Tetracyclines & Related Swelling and Rash  . Topiramate Other (See Comments)    Dizziness     Medications Prior to Admission  Medication Sig Dispense Refill  . ACAI BERRY PO Take 1 tablet by mouth daily.    Marland Kitchen BLACK COHOSH COMPOUND PO Take 1 tablet by mouth daily.     . Cholecalciferol (VITAMIN D3) 5000 UNITS CAPS Take 5,000 Units by mouth daily.     . CHROMIUM PO Take 1 tablet by mouth daily.    . COCONUT OIL PO Take 1 tablet by mouth daily.    Marland Kitchen DHEA 50 MG TABS Take 50 mg by mouth daily.     Marland Kitchen esomeprazole (NEXIUM) 20 MG capsule Take 20 mg by mouth daily as needed (acid reflux).    . furosemide (LASIX) 40 MG tablet Take 40 mg by mouth daily as needed for fluid or edema.     Marland Kitchen GARLIC PO Take 1 tablet by mouth daily.    Chilton Si Tea, Camillia sinensis, (GREEN TEA PO) Take 1 tablet by mouth daily.    Marland Kitchen LORazepam (ATIVAN) 1 MG tablet Take 1-4 mg by mouth 2 (two) times daily.  during the day and  at bedtime    . MAGNESIUM PO Take 1 tablet by mouth daily.    . Melatonin 10 MG CAPS Take 10 mg by mouth every evening.     . Misc Natural Products (TART CHERRY ADVANCED PO) Take 1 tablet by mouth daily.    Marland Kitchen morphine (MS CONTIN) 60 MG 12 hr tablet Take 60 mg by mouth every 8 (eight) hours.     . Probiotic Product (PROBIOTIC PO) Take 1 tablet by mouth daily as needed (stomach).    . promethazine (PHENERGAN) 25 MG tablet Take 25 mg by mouth every 6 (six) hours as needed for nausea.     . Pyridoxine HCl (VITAMIN B-6 PO) Take 1 tablet by mouth daily as needed (energy).    Marland Kitchen senna (SENOKOT) 8.6 MG TABS tablet Take 5 tablets by mouth at bedtime.    . Thiamine HCl (VITAMIN B-1 PO) Take 1 tablet by mouth daily as needed (energy).    Marland Kitchen tiZANidine (ZANAFLEX) 4 MG tablet Take 4 mg by mouth QID.     Marland Kitchen TURMERIC CURCUMIN PO Take 1 tablet by mouth daily.    Marland Kitchen BIOTIN PO Take 1 tablet by mouth daily.    . Multiple Vitamins-Minerals (ZINC PO) Take 1 tablet by mouth daily.    . Zolpidem Tartrate (AMBIEN PO) Take 1 tablet by mouth at bedtime as needed (sleep).      No results found for this or any  previous visit (from the past 48 hour(s)). No results found.   Blood pressure 100/58, pulse 67, temperature 97.2 F (36.2 C), temperature source Oral, resp. rate 18, height 5' 6.5" (1.689 m), weight 72.666 kg (160 lb 3.2 oz), SpO2 98 %.  The patient is awake alert and oriented. She is no  facial asymmetry. Her gait is nonantalgic. Reflexes are 1-2+ and equal. She is normal motor strength and normal sensation. 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 The plan is for an L3-4 lateral fusion followed by percutaneous pedicle screw instrumentation.  Reinaldo Meeker, MD 10/07/2014, 10:17 AM

## 2014-10-07 NOTE — Anesthesia Preprocedure Evaluation (Addendum)
Anesthesia Evaluation  Patient identified by MRN, date of birth, ID band Patient awake    Reviewed: Allergy & Precautions, NPO status , Patient's Chart, lab work & pertinent test results  History of Anesthesia Complications Negative for: history of anesthetic complications  Airway Mallampati: II  TM Distance: >3 FB Neck ROM: Full    Dental no notable dental hx. (+) Dental Advisory Given   Pulmonary COPD, Current Smoker,    Pulmonary exam normal breath sounds clear to auscultation       Cardiovascular negative cardio ROS Normal cardiovascular exam Rhythm:Regular Rate:Normal     Neuro/Psych PSYCHIATRIC DISORDERS Anxiety Depression negative neurological ROS     GI/Hepatic Neg liver ROS, GERD  Medicated and Controlled,  Endo/Other  negative endocrine ROS  Renal/GU negative Renal ROS  negative genitourinary   Musculoskeletal  (+) Arthritis ,   Abdominal   Peds negative pediatric ROS (+)  Hematology negative hematology ROS (+)   Anesthesia Other Findings   Reproductive/Obstetrics negative OB ROS                            Anesthesia Physical Anesthesia Plan  ASA: III  Anesthesia Plan: General   Post-op Pain Management:    Induction: Intravenous  Airway Management Planned: Oral ETT  Additional Equipment:   Intra-op Plan:   Post-operative Plan: Extubation in OR  Informed Consent: I have reviewed the patients History and Physical, chart, labs and discussed the procedure including the risks, benefits and alternatives for the proposed anesthesia with the patient or authorized representative who has indicated his/her understanding and acceptance.   Dental advisory given  Plan Discussed with:   Anesthesia Plan Comments:        Anesthesia Quick Evaluation

## 2014-10-07 NOTE — Op Note (Signed)
Preop diagnosis: Adjacent level disease with lumbar stenosis, L3-4, status post fusion L4-5 L5-S1 Postop diagnosis: Same Procedure: L3-4 anterolateral fusion by way of left retroperitoneal approach with peek interbody spacer L3-4 nonsegmental instrumentation with Pathfinder percutaneous pedicle screw system Surgeon: Elisandro Jarrett Asst.: Cabbell  After being placed in the left side up lateral decubitus position the patient was secured to the bed With x-ray in standard fashion. Patient's left flank was then prepped and draped in the usual sterile fashion. When the incision was made over the L3-4 disc space and carried down through the subcutaneous tissue fascia muscle and deep fascia and iliotibial the retroperitoneum without difficulty. I used finger dissection to sweep away any tissue admitted sequential dilation through the so as muscle. EMG monitoring was carried out and no abnormal readings were encountered. We then placed a retractor and aligned in good fashion based on AP and lateral fluoroscopy. We then removed the initial dilators secured the retractor to the table and then did EMG testing once more. We then secured the shim to the disc space into good position. We opened up the retractor in all directions confirmed good exposure and fluoroscopy and then coagulated any residual muscle on top of the disc space. We incised the disc space with a 15 blade and used a variety of instruments to thoroughly cleaned out. We release the annulus on the opposite side with the small Cobb elevator. With this space was completely cleaned out we dilated initially to an 8 mm height min to a 10 mm height we felt this was a good choice. We chose a 10 x 18 x 45 mm graft and filled with a mixture of autologous bone and morselized allograft. We irrigated copiously and then impacted the graft in standard fashion without difficulty over the slides. We then removed the retractor after irrigated once more and final fluoroscopy in AP  lateral direction looked excellent. We closed the wound in multiple layers of I Croke with Dermabond and Steri-Strips on the skin. We then turned the patient the prone position in the back was prepped and draped in the usual sterile fashion. We made for stab wound entry points past Jamshidi needle from lateral to medial direction to the pedicles of L3 and L4 bilaterally. We used the ultrasonic guided needles for safety sake. We put wires over the needles and removed the Jamshidi needles. We connected the 2 small incisions on each side. We tapped with a 6 Miller tap and then placed 6.5 x 40 mm screws at L4 and L3 bilaterally. These were followed in excellent position. We passed appropriately length rods down the towers and secured them to the top of the screws with top loading nuts. We did tightening and final tightening with torque and counter torque and then removed the [employer]. Final fluoroscopy in AP lateral direction looked excellent. These wounds were then irrigated copiously and then closed with Vicryls on the fascia subcutaneous and subcuticular tissues. Dermabond and Steri-Strips were placed on the skin. Sterile dressings were then applied and the patient was extubated and taken to recovery room in stable condition.

## 2014-10-07 NOTE — Progress Notes (Signed)
Pt. Alert and oriented although drowsy. Stated she had taken ativan, pain medication, and phenergan this am at 0600. Pt. Had not signed consent form, wanting to talk to Dr. Gerlene Fee before she signed it.  Notified Dr. Gentry Roch of above.

## 2014-10-07 NOTE — Anesthesia Postprocedure Evaluation (Signed)
  Anesthesia Post-op Note  Patient: Erin Riddle  Procedure(s) Performed: Procedure(s) (LRB): Anterior Lateral Lumbar interbody Fusion  - Lumbar three-lumbar four- left side approach  (Left) LUMBAR PERCUTANEOUS PEDICLE SCREW Lumbar three-four (N/A)  Patient Location: PACU  Anesthesia Type: General  Level of Consciousness: awake and alert   Airway and Oxygen Therapy: Patient Spontanous Breathing  Post-op Pain: mild  Post-op Assessment: Post-op Vital signs reviewed, Patient's Cardiovascular Status Stable, Respiratory Function Stable, Patent Airway and No signs of Nausea or vomiting  Last Vitals:  Filed Vitals:   10/07/14 1513  BP:   Pulse: 78  Temp:   Resp: 15    Post-op Vital Signs: stable   Complications: No apparent anesthesia complications

## 2014-10-08 ENCOUNTER — Encounter (HOSPITAL_COMMUNITY): Payer: Self-pay | Admitting: Neurosurgery

## 2014-10-08 MED ORDER — PANTOPRAZOLE SODIUM 40 MG PO TBEC
40.0000 mg | DELAYED_RELEASE_TABLET | Freq: Every day | ORAL | Status: DC
  Administered 2014-10-08: 40 mg via ORAL
  Filled 2014-10-08: qty 1

## 2014-10-08 NOTE — Evaluation (Signed)
Physical Therapy Evaluation Patient Details Name: Erin Riddle MRN: [id number] DOB: [birth year] Today's Date: 10/08/2014   History of Present Illness  pt presents with L3-4 ALIF with Pedicle screw and hx of multiple back surgeries, Anxiety, and COPD.    Clinical Impression  Pt mobility limited today due to being so drowsy from IV meds.  Pt falling asleep sitting EOB and requires consistent cues to A with maintaining arousal.  Pt c/o terrible pain in her back despite IV pain meds and falling asleep while talking to PT.  Anticipate as pt has pain controlled better and less drowsy, she will make good progress to return to home.  Will continue to follow.      Follow Up Recommendations Home health PT;Supervision - Intermittent    Equipment Recommendations  Rolling walker with 5" wheels;3in1 (PT)    Recommendations for Other Services       Precautions / Restrictions Precautions Precautions: Fall;Back Precaution Booklet Issued: No Precaution Comments: Reviewed back precautions Required Braces or Orthoses: Spinal Brace Spinal Brace: Lumbar corset;Applied in sitting position Restrictions Weight Bearing Restrictions: No      Mobility  Bed Mobility Overal bed mobility: Needs Assistance Bed Mobility: Rolling;Sidelying to Sit;Sit to Sidelying Rolling: Supervision Sidelying to sit: Min assist     Sit to sidelying: Min assist General bed mobility comments: cues for log roll technique and not twisting.  A with bringing trunk up to sitting and returning LEs back to bed.    Transfers Overall transfer level: Needs assistance Equipment used: Rolling walker (2 wheeled) Transfers: Sit to/from [employer] Sit to Stand: Min assist Stand pivot transfers: Min assist       General transfer comment: cues for UE use, scooting to edge prior to coming to standing, and movement through pivot transfer on/off 3-in-1.    Ambulation/Gait                Stairs             Wheelchair Mobility    Modified Rankin (Stroke Patients Only)       Balance Overall balance assessment: Needs assistance Sitting-balance support: Single extremity supported;Bilateral upper extremity supported;Feet supported Sitting balance-Leahy Scale: Fair     Standing balance support: Bilateral upper extremity supported;During functional activity Standing balance-Leahy Scale: Fair                               Pertinent Vitals/Pain Pain Assessment: 0-10 Pain Score: 8  Pain Location: Back Pain Descriptors / Indicators: Sore Pain Intervention(s): Monitored during session;Premedicated before session;Repositioned    Home Living Family/patient expects to be discharged to:: Private residence Living Arrangements: Spouse/significant other Available Help at Discharge: Family;Available PRN/intermittently (Unclear amount of A.) Type of Home: House Home Access: Stairs to enter   [employer] of Steps: "few" Home Layout: One level   Additional Comments: Vague answers when asked about amount of A from family.      Prior Function Level of Independence: Independent               Hand Dominance        Extremity/Trunk Assessment   Upper Extremity Assessment: Defer to OT evaluation           Lower Extremity Assessment: Generalized weakness      Cervical / Trunk Assessment: Normal  Communication   Communication: No difficulties  Cognition Arousal/Alertness: Suspect due to medications (Drowsy, but arousable.) Behavior During Therapy: Flat affect  Overall Cognitive Status: Within Functional Limits for tasks assessed                      General Comments      Exercises        Assessment/Plan    PT Assessment Patient needs continued PT services  PT Diagnosis Difficulty walking   PT Problem List Decreased strength;Decreased activity tolerance;Decreased balance;Decreased mobility;Decreased knowledge of use of DME;Decreased  knowledge of precautions;Pain  PT Treatment Interventions DME instruction;Gait training;Stair training;Functional mobility training;Therapeutic activities;Therapeutic exercise;Balance training;Neuromuscular re-education;Patient/family education   PT Goals (Current goals can be found in the Care Plan section) Acute Rehab PT Goals Patient Stated Goal: Home soon. PT Goal Formulation: With patient Time For Goal Achievement: 10/15/14 Potential to Achieve Goals: Good    Frequency Min 5X/week   Barriers to discharge        Co-evaluation               End of Session Equipment Utilized During Treatment: Back brace Activity Tolerance: Patient limited by fatigue Patient left: in bed;with call bell/phone within reach;with bed alarm set Nurse Communication: Mobility status         Time: 1350-1425 PT Time Calculation (min) (ACUTE ONLY): 35 min   Charges:   PT Evaluation $Initial PT Evaluation Tier I: 1 Procedure PT Treatments $Therapeutic Activity: 8-22 mins   PT G CodesSunny Schlein, [place] [phone] 10/08/2014, 2:44 PM

## 2014-10-08 NOTE — Progress Notes (Signed)
Occupational Therapy Evaluation Patient Details Name: Erin Riddle MRN: [id number] DOB: 05-Jul-1950 Today's Date: 10/08/2014    History of Present Illness pt presents with L3-4 ALIF with Pedicle screw and hx of multiple back surgeries, Anxiety, and COPD.     Clinical Impression   Pt admitted with the above diagnoses and presents with below problem list. Pt will benefit from continued acute OT to address the below listed deficits and maximize independence with BADLs prior to d/c home. PTA pt was independent with ADLs. Pt is currently min A for most ADLs. Pt moving slowly due to pain despite being premedicated and drowsy. Of note, pt with noticable cough including some bloody sputum. O2 assessed in supine at end of session via pulse-ox 91 initially then 87. [place] applied with 2L of O2 with O2 recovering to 91. Instructed in breathing techniques. Nursing notified. Anticipate once pt feeling better will progress to level to d/c safely to home. OT to continue to follow acutely.       Follow Up Recommendations  Supervision/Assistance - 24 hour;No OT follow up    Equipment Recommendations  3 in 1 bedside comode    Recommendations for Other Services PT consult     Precautions / Restrictions Precautions Precautions: Fall;Back Precaution Booklet Issued: No Precaution Comments: Reviewed back precautions Required Braces or Orthoses: Spinal Brace Spinal Brace: Lumbar corset;Applied in sitting position Restrictions Weight Bearing Restrictions: No      Mobility Bed Mobility Overal bed mobility: Needs Assistance Bed Mobility: Rolling;Sidelying to Sit;Sit to Sidelying Rolling: Min guard Sidelying to sit: Min assist     Sit to sidelying: Min assist General bed mobility comments: assist to maintain precautions and to powerup trunk  Transfers Overall transfer level: Needs assistance Equipment used: Rolling walker (2 wheeled) Transfers: Sit to/from Stand Sit to Stand: Min assist Stand  pivot transfers: Min assist       General transfer comment: cues for technique with rw; therapist blocking rw due to pt pulling up on rw despite hand placement cues.     Balance Overall balance assessment: Needs assistance Sitting-balance support: Feet supported Sitting balance-Leahy Scale: Fair     Standing balance support: Bilateral upper extremity supported;During functional activity Standing balance-Leahy Scale: Fair Standing balance comment: external support for balance                            ADL Overall ADL's : Needs assistance/impaired Eating/Feeding: Set up;Sitting   Grooming: Set up;Sitting;Cueing for compensatory techniques;Min guard   Upper Body Bathing: Minimal assitance;Sitting;Cueing for compensatory techniques   Lower Body Bathing: Minimal assistance;Cueing for compensatory techniques;Sit to/from stand   Upper Body Dressing : Minimal assistance;Sitting   Lower Body Dressing: Minimal assistance;Cueing for compensatory techniques;Sit to/from stand   Toilet Transfer: Minimal assistance;Ambulation;RW;BSC Toilet Transfer Details (indicate cue type and reason): BSC over toilet Toileting- Clothing Manipulation and Hygiene: Sit to/from stand;Moderate assistance   Tub/ Shower Transfer: Minimal assistance;Ambulation;3 in 1   Functional mobility during ADLs: Min guard;Rolling walker General ADL Comments: Pt drowsy during session with eyes often closed. Pt needing to bo to the bathroom upon therapist arrival. Pt noted to be impulsive at times with some decreased safety awareness. Min A for transfers for safety due to balance, cues for technique with pt not following cues at times.      Vision     Perception     Praxis      Pertinent Vitals/Pain Pain Assessment: Faces Pain Score: 8  Faces Pain Scale: Hurts whole lot Pain Location: back Pain Descriptors / Indicators: Grimacing Pain Intervention(s): Limited activity within patient's  tolerance;Monitored during session;Premedicated before session;Repositioned     Hand Dominance     Extremity/Trunk Assessment Upper Extremity Assessment Upper Extremity Assessment: Overall WFL for tasks assessed;Generalized weakness   Lower Extremity Assessment Lower Extremity Assessment: Defer to PT evaluation   Cervical / Trunk Assessment Cervical / Trunk Assessment: Normal   Communication Communication Communication: No difficulties   Cognition Arousal/Alertness: Suspect due to medications (drowsy) Behavior During Therapy: Impulsive;WFL for tasks assessed/performed;Flat affect Overall Cognitive Status: Impaired/Different from baseline Area of Impairment: Attention;Safety/judgement   Current Attention Level: Sustained     Safety/Judgement: Decreased awareness of safety         General Comments    Pt with noticable cough including some bloody sputum. O2 assessed in supine at end of session via pulse-ox 91 initially then 87. [place] applied with 2L of O2 with O2 recovering to 91. Instructed in breathing techniques. Nursing notified. Pt adjusting back brace in standing, declining to sit despite therapist instructing pt to sit to adjust. Cues for back precautions during session.     Exercises       Shoulder Instructions      Home Living Family/patient expects to be discharged to:: Private residence Living Arrangements: Spouse/significant other Available Help at Discharge: Family;Available PRN/intermittently Type of Home: House Home Access: Stairs to enter [employer] of Steps: "few"   Home Layout: One level                   Additional Comments: Vague answers when asked about amount of A from family.        Prior Functioning/Environment Level of Independence: Independent             OT Diagnosis: Acute pain;Generalized weakness   OT Problem List: Impaired balance (sitting and/or standing);Decreased knowledge of use of DME or AE;Decreased  knowledge of precautions;Pain   OT Treatment/Interventions: Self-care/ADL training;DME and/or AE instruction;Visual/perceptual remediation/compensation;Patient/family education;Therapeutic activities    OT Goals(Current goals can be found in the care plan section) Acute Rehab OT Goals Patient Stated Goal: Home soon. OT Goal Formulation: With patient Time For Goal Achievement: 10/15/14 Potential to Achieve Goals: Good ADL Goals Pt Will Perform Grooming: with modified independence;standing Pt Will Perform Upper Body Bathing: with modified independence;sitting Pt Will Perform Lower Body Bathing: with modified independence;with adaptive equipment;sit to/from stand Pt Will Perform Upper Body Dressing: with modified independence;sitting Pt Will Perform Lower Body Dressing: with modified independence;with adaptive equipment;sit to/from stand Pt Will Transfer to Toilet: with modified independence;ambulating (3n1 over toilet) Pt Will Perform Toileting - Clothing Manipulation and hygiene: with modified independence;with adaptive equipment;sitting/lateral leans;sit to/from stand Pt Will Perform Tub/Shower Transfer: with supervision;ambulating;3 in 1;rolling walker Additional ADL Goal #1: Pt will complete logrolling for bed mobility at mod I level to prepare for OOB ADLs.   OT Frequency: Min 2X/week   Barriers to D/C: Decreased caregiver support;Other (comment) (no plan in place but family indicated they could have help)          Co-evaluation              End of Session Equipment Utilized During Treatment: Gait belt;Rolling walker;Back brace Nurse Communication: Other (comment) (see general comments)  Activity Tolerance: Patient limited by lethargy Patient left: in bed;with call bell/phone within reach;with nursing/sitter in room;with SCD's reapplied   Time: 1105-1130 OT Time Calculation (min): 25 min Charges:  OT General Charges $OT  Visit: 1 Procedure OT Evaluation $Initial OT  Evaluation Tier I: 1 Procedure OT Treatments $Self Care/Home Management : 8-22 mins G-Codes:    Pilar Grammes Oct 17, 2014, 3:10 PM

## 2014-10-08 NOTE — Progress Notes (Signed)
Patient ID: Erin Riddle, female   DOB: 26-Mar-1950, 64 y.o.   MRN: [id number] Afeb, vss VERY slow to get moving due to incisional pain. Will get PT to help increase activity. Wounds clean and dry. Mobilize as tolerated.

## 2014-10-09 MED ORDER — HYDROMORPHONE HCL 4 MG PO TABS: 4.0000 mg | ORAL_TABLET | ORAL | Status: AC | PRN

## 2014-10-09 NOTE — Progress Notes (Signed)
Patient walked up and down the hall this evening with brace and walker. Educated on how to turn and what to avoid. Pain medications given frequently this evening. Continuing to monitor. Karolyne Timmons, [place]rape, RN

## 2014-10-09 NOTE — Care Management Note (Signed)
Case Management Note  Patient Details  Name: Erin Riddle MRN: [id number] Date of Birth: 1950/09/26  Subjective/Objective:                    Action/Plan: Patient admitted with lumbar stenosis. Pt had a L3-4 ALIF. Patient lives at home with spouse. Plan is to discharge patient home today. MD not wanting any home health service. Bedside RN aware.   Expected Discharge Date:                  Expected Discharge Plan:  Home/Self Care  In-House Referral:     Discharge planning Services  CM Consult  Post Acute Care Choice:    Choice offered to:     DME Arranged:    DME Agency:     HH Arranged:    HH Agency:     Status of Service:  Completed, signed off  Medicare Important Message Given:    Date Medicare IM Given:    Medicare IM give by:    Date Additional Medicare IM Given:    Additional Medicare Important Message give by:     If discussed at Long Length of Stay Meetings, dates discussed:    Additional Comments:  Kermit Balo, RN 10/09/2014, 1:52 PM

## 2014-10-09 NOTE — Progress Notes (Signed)
Discharge orders received. Pt educated on discharge instructions and spine precautions. Pt verbalized understanding. Pt given discharge packet and prescription. IV removed. Pt taken downstairs by staff via wheelchair.

## 2014-10-09 NOTE — Discharge Summary (Signed)
Physician Discharge Summary  Patient ID: Erin Riddle MRN: [id number] DOB/AGE: 06-20-1950 64 y.o.  Admit date: 10/07/2014 Discharge date: 10/09/2014  Admission Diagnoses:  Discharge Diagnoses:  Active Problems:   Lumbar stenosis with neurogenic claudication   Discharged Condition: good  Hospital Course: Surgery 2 days ago for lateral fusion. Did very well with marked improvement in her pain. Slowly increased her activity, and ambulated well by pod 2. Wounds all clean and dry. Home pod 2, specific instructions given.  Consults: None  Significant Diagnostic Studies: none  Treatments: surgery: L 34 lateral fusion with pedicle screw fixation.  Discharge Exam: Blood pressure 130/66, pulse 91, temperature 98.2 F (36.8 C), temperature source Oral, resp. rate 20, height 5' 6.5" (1.689 m), weight 72.666 kg (160 lb 3.2 oz), SpO2 98 %. Incision/Wound:clean and dry; no new neuro issues  Disposition: Final discharge disposition not confirmed     Medication List    ASK your doctor about these medications        ACAI BERRY PO  Take 1 tablet by mouth daily.     AMBIEN PO  Take 1 tablet by mouth at bedtime as needed (sleep).     BIOTIN PO  Take 1 tablet by mouth daily.     BLACK COHOSH COMPOUND PO  Take 1 tablet by mouth daily.     CHROMIUM PO  Take 1 tablet by mouth daily.     COCONUT OIL PO  Take 1 tablet by mouth daily.     DHEA 50 MG Tabs  Take 50 mg by mouth daily.     esomeprazole 20 MG capsule  Commonly known as:  NEXIUM  Take 20 mg by mouth daily as needed (acid reflux).     furosemide 40 MG tablet  Commonly known as:  LASIX  Take 40 mg by mouth daily as needed for fluid or edema.     GARLIC PO  Take 1 tablet by mouth daily.     GREEN TEA PO  Take 1 tablet by mouth daily.     LORazepam 1 MG tablet  Commonly known as:  ATIVAN  Take 1-4 mg by mouth 2 (two) times daily.  during the day and  at bedtime     MAGNESIUM PO  Take 1 tablet by mouth  daily.     Melatonin 10 MG Caps  Take 10 mg by mouth every evening.     morphine 60 MG 12 hr tablet  Commonly known as:  MS CONTIN  Take 60 mg by mouth every 8 (eight) hours.     PROBIOTIC PO  Take 1 tablet by mouth daily as needed (stomach).     promethazine 25 MG tablet  Commonly known as:  PHENERGAN  Take 25 mg by mouth every 6 (six) hours as needed for nausea.     senna 8.6 MG Tabs tablet  Commonly known as:  SENOKOT  Take 5 tablets by mouth at bedtime.     TART CHERRY ADVANCED PO  Take 1 tablet by mouth daily.     tiZANidine 4 MG tablet  Commonly known as:  ZANAFLEX  Take 4 mg by mouth QID.     TURMERIC CURCUMIN PO  Take 1 tablet by mouth daily.     VITAMIN B-1 PO  Take 1 tablet by mouth daily as needed (energy).     VITAMIN B-6 PO  Take 1 tablet by mouth daily as needed (energy).     Vitamin D3 5000 UNITS Caps  Take 5,000 Units  by mouth daily.     ZINC PO  Take 1 tablet by mouth daily.         At home rest most of the time. Get up 9 or 10 times each day and take a 15 or 20 minute walk. No riding in the car and to your first postoperative appointment. If you have neck surgery you may shower from the chest down starting on the third postoperative day. If you had back surgery he may start showering on the third postoperative day with saran wrap wrapped around your incisional area 3 times. After the shower remove the saran wrap. Take pain medicine as needed and other medications as instructed. Call my office for an appointment.  Signed: Reinaldo Meeker, MD 10/09/2014, 1:05 PM

## 2014-12-25 ENCOUNTER — Other Ambulatory Visit: Payer: Self-pay | Admitting: Neurosurgery

## 2014-12-25 DIAGNOSIS — M48061 Spinal stenosis, lumbar region without neurogenic claudication: Secondary | ICD-10-CM

## 2015-01-02 ENCOUNTER — Ambulatory Visit
Admission: RE | Admit: 2015-01-02 | Discharge: 2015-01-02 | Disposition: A | Discharge status: Home / Self Care | Payer: Medicare HMO | Source: Ambulatory Visit | Attending: Neurosurgery | Admitting: Neurosurgery

## 2015-01-02 VITALS — BP 141/76 | HR 88

## 2015-01-02 DIAGNOSIS — M48061 Spinal stenosis, lumbar region without neurogenic claudication: Secondary | ICD-10-CM

## 2015-01-02 DIAGNOSIS — M48062 Spinal stenosis, lumbar region with neurogenic claudication: Secondary | ICD-10-CM

## 2015-01-02 MED ORDER — MEPERIDINE HCL 100 MG/ML IJ SOLN
75.0000 mg | Freq: Once | INTRAMUSCULAR | Status: AC
  Administered 2015-01-02: 75 mg via INTRAMUSCULAR

## 2015-01-02 MED ORDER — DIAZEPAM 5 MG PO TABS
5.0000 mg | ORAL_TABLET | Freq: Once | ORAL | Status: AC
  Administered 2015-01-02: 5 mg via ORAL

## 2015-01-02 MED ORDER — ONDANSETRON HCL 4 MG/2ML IJ SOLN
4.0000 mg | Freq: Once | INTRAMUSCULAR | Status: AC
  Administered 2015-01-02: 4 mg via INTRAMUSCULAR

## 2015-01-02 MED ORDER — IOHEXOL 180 MG/ML  SOLN
15.0000 mL | Freq: Once | INTRAMUSCULAR | Status: AC | PRN
  Administered 2015-01-02: 15 mL via INTRATHECAL

## 2015-01-02 NOTE — Discharge Instructions (Signed)
Myelogram Discharge Instructions  1. Go home and rest quietly for the next 24 hours.  It is important to lie flat for the next 24 hours.  Get up only to go to the restroom.  You may lie in the bed or on a couch on your back, your stomach, your left side or your right side.  You may have one pillow under your head.  You may have pillows between your knees while you are on your side or under your knees while you are on your back.  2. DO NOT drive today.  Recline the seat as far back as it will go, while still wearing your seat belt, on the way home.  3. You may get up to go to the bathroom as needed.  You may sit up for 10 minutes to eat.  You may resume your normal diet and medications unless otherwise indicated.  Drink lots of extra fluids today and tomorrow.  4. The incidence of headache, nausea, or vomiting is about 5% (one in 20 patients).  If you develop a headache, lie flat and drink plenty of fluids until the headache goes away.  Caffeinated beverages may be helpful.  If you develop severe nausea and vomiting or a headache that does not go away with flat bed rest, call [phone].  5. You may resume normal activities after your 24 hours of bed rest is over; however, do not exert yourself strongly or do any heavy lifting tomorrow. If when you get up you have a headache when standing, go back to bed and force fluids for another 24 hours.  6. Call your physician for a follow-up appointment.  The results of your myelogram will be sent directly to your physician by the following day.  7. If you have any questions or if complications develop after you arrive home, please call [phone].  Discharge instructions have been explained to the patient.  The patient, or the person responsible for the patient, fully understands these instructions.       May resume [employer] on Dec. 31, 2016, after 1:00 pm.

## 2015-02-20 DIAGNOSIS — M7062 Trochanteric bursitis, left hip: Secondary | ICD-10-CM | POA: Insufficient documentation

## 2015-02-20 DIAGNOSIS — K2100 Gastro-esophageal reflux disease with esophagitis, without bleeding: Secondary | ICD-10-CM | POA: Insufficient documentation

## 2015-02-20 DIAGNOSIS — M797 Fibromyalgia: Secondary | ICD-10-CM | POA: Insufficient documentation

## 2015-02-20 DIAGNOSIS — E049 Nontoxic goiter, unspecified: Secondary | ICD-10-CM | POA: Insufficient documentation

## 2015-02-20 DIAGNOSIS — N951 Menopausal and female climacteric states: Secondary | ICD-10-CM | POA: Insufficient documentation

## 2015-02-20 DIAGNOSIS — K5900 Constipation, unspecified: Secondary | ICD-10-CM | POA: Insufficient documentation

## 2015-02-20 DIAGNOSIS — R6889 Other general symptoms and signs: Secondary | ICD-10-CM | POA: Insufficient documentation

## 2015-02-20 DIAGNOSIS — J45901 Unspecified asthma with (acute) exacerbation: Secondary | ICD-10-CM | POA: Insufficient documentation

## 2015-02-20 DIAGNOSIS — M5137 Other intervertebral disc degeneration, lumbosacral region: Secondary | ICD-10-CM | POA: Insufficient documentation

## 2015-02-20 DIAGNOSIS — F988 Other specified behavioral and emotional disorders with onset usually occurring in childhood and adolescence: Secondary | ICD-10-CM | POA: Insufficient documentation

## 2015-02-20 DIAGNOSIS — G8929 Other chronic pain: Secondary | ICD-10-CM | POA: Insufficient documentation

## 2015-02-20 DIAGNOSIS — M25519 Pain in unspecified shoulder: Secondary | ICD-10-CM | POA: Insufficient documentation

## 2015-02-20 DIAGNOSIS — F329 Major depressive disorder, single episode, unspecified: Secondary | ICD-10-CM | POA: Insufficient documentation

## 2015-04-29 ENCOUNTER — Other Ambulatory Visit: Payer: Self-pay | Admitting: Family Medicine

## 2015-04-29 DIAGNOSIS — G8929 Other chronic pain: Secondary | ICD-10-CM

## 2015-04-29 DIAGNOSIS — M545 Low back pain, unspecified: Secondary | ICD-10-CM

## 2015-05-21 ENCOUNTER — Ambulatory Visit
Admission: RE | Admit: 2015-05-21 | Discharge: 2015-05-21 | Disposition: A | Discharge status: Home / Self Care | Payer: Medicare HMO | Source: Ambulatory Visit | Attending: Family Medicine | Admitting: Family Medicine

## 2015-05-21 VITALS — BP 138/61 | HR 86

## 2015-05-21 DIAGNOSIS — M545 Low back pain: Secondary | ICD-10-CM

## 2015-05-21 DIAGNOSIS — M48062 Spinal stenosis, lumbar region with neurogenic claudication: Secondary | ICD-10-CM

## 2015-05-21 DIAGNOSIS — G8929 Other chronic pain: Secondary | ICD-10-CM

## 2015-05-21 MED ORDER — IOPAMIDOL (ISOVUE-M 200) INJECTION 41%
1.0000 mL | Freq: Once | INTRAMUSCULAR | Status: AC
  Administered 2015-05-21: 1 mL via EPIDURAL

## 2015-05-21 MED ORDER — METHYLPREDNISOLONE ACETATE 40 MG/ML INJ SUSP (RADIOLOG
120.0000 mg | Freq: Once | INTRAMUSCULAR | Status: AC
  Administered 2015-05-21: 120 mg via EPIDURAL

## 2015-05-21 MED ORDER — DIAZEPAM 5 MG PO TABS
5.0000 mg | ORAL_TABLET | Freq: Once | ORAL | Status: AC
  Administered 2015-05-21: 5 mg via ORAL

## 2015-05-21 NOTE — Discharge Instructions (Signed)

## 2015-11-10 ENCOUNTER — Other Ambulatory Visit: Payer: Self-pay | Admitting: Family Medicine

## 2015-11-10 DIAGNOSIS — G8929 Other chronic pain: Secondary | ICD-10-CM

## 2015-11-10 DIAGNOSIS — Z981 Arthrodesis status: Secondary | ICD-10-CM

## 2015-11-19 ENCOUNTER — Ambulatory Visit
Admission: RE | Admit: 2015-11-19 | Discharge: 2015-11-19 | Disposition: A | Discharge status: Home / Self Care | Payer: Medicare HMO | Source: Ambulatory Visit | Attending: Family Medicine | Admitting: Family Medicine

## 2015-11-19 VITALS — BP 102/54 | HR 72

## 2015-11-19 DIAGNOSIS — Z981 Arthrodesis status: Secondary | ICD-10-CM

## 2015-11-19 DIAGNOSIS — G8929 Other chronic pain: Secondary | ICD-10-CM

## 2015-11-19 DIAGNOSIS — M5137 Other intervertebral disc degeneration, lumbosacral region: Secondary | ICD-10-CM

## 2015-11-19 MED ORDER — METHYLPREDNISOLONE ACETATE 40 MG/ML INJ SUSP (RADIOLOG
120.0000 mg | Freq: Once | INTRAMUSCULAR | Status: AC
  Administered 2015-11-19: 120 mg via EPIDURAL

## 2015-11-19 MED ORDER — IOPAMIDOL (ISOVUE-M 200) INJECTION 41%
1.0000 mL | Freq: Once | INTRAMUSCULAR | Status: AC
  Administered 2015-11-19: 1 mL via EPIDURAL

## 2015-11-19 MED ORDER — DIAZEPAM 5 MG PO TABS
5.0000 mg | ORAL_TABLET | Freq: Once | ORAL | Status: AC
  Administered 2015-11-19: 5 mg via ORAL

## 2015-11-30 ENCOUNTER — Other Ambulatory Visit: Payer: Medicare HMO

## 2015-12-18 ENCOUNTER — Other Ambulatory Visit: Payer: Self-pay | Admitting: Family Medicine

## 2015-12-18 DIAGNOSIS — Z981 Arthrodesis status: Secondary | ICD-10-CM

## 2015-12-18 DIAGNOSIS — G8929 Other chronic pain: Secondary | ICD-10-CM

## 2015-12-30 ENCOUNTER — Other Ambulatory Visit: Payer: Self-pay | Admitting: Family Medicine

## 2015-12-30 ENCOUNTER — Ambulatory Visit
Admission: RE | Admit: 2015-12-30 | Discharge: 2015-12-30 | Disposition: A | Discharge status: Home / Self Care | Payer: Medicare HMO | Source: Ambulatory Visit | Attending: Family Medicine | Admitting: Family Medicine

## 2015-12-30 VITALS — BP 112/54 | HR 72

## 2015-12-30 DIAGNOSIS — G8929 Other chronic pain: Secondary | ICD-10-CM

## 2015-12-30 DIAGNOSIS — M48062 Spinal stenosis, lumbar region with neurogenic claudication: Secondary | ICD-10-CM

## 2015-12-30 DIAGNOSIS — M51379 Other intervertebral disc degeneration, lumbosacral region without mention of lumbar back pain or lower extremity pain: Secondary | ICD-10-CM

## 2015-12-30 DIAGNOSIS — M5137 Other intervertebral disc degeneration, lumbosacral region: Secondary | ICD-10-CM

## 2015-12-30 DIAGNOSIS — Z981 Arthrodesis status: Secondary | ICD-10-CM

## 2015-12-30 MED ORDER — METHYLPREDNISOLONE ACETATE 40 MG/ML INJ SUSP (RADIOLOG
120.0000 mg | Freq: Once | INTRAMUSCULAR | Status: AC
  Administered 2015-12-30: 120 mg via INTRA_ARTICULAR

## 2015-12-30 MED ORDER — DIAZEPAM 5 MG PO TABS
5.0000 mg | ORAL_TABLET | Freq: Once | ORAL | Status: AC
  Administered 2015-12-30: 5 mg via ORAL

## 2015-12-30 MED ORDER — METHYLPREDNISOLONE ACETATE 40 MG/ML INJ SUSP (RADIOLOG: 120.0000 mg | Freq: Once | INTRAMUSCULAR | Status: DC

## 2015-12-30 MED ORDER — IOPAMIDOL (ISOVUE-M 200) INJECTION 41%: 1.0000 mL | Freq: Once | INTRAMUSCULAR | Status: DC

## 2015-12-30 MED ORDER — IOPAMIDOL (ISOVUE-M 200) INJECTION 41%
1.0000 mL | Freq: Once | INTRAMUSCULAR | Status: AC
  Administered 2015-12-30: 1 mL via INTRA_ARTICULAR

## 2015-12-30 NOTE — Discharge Instructions (Signed)

## 2016-01-22 ENCOUNTER — Other Ambulatory Visit: Payer: Self-pay | Admitting: Family Medicine

## 2016-01-22 DIAGNOSIS — M5137 Other intervertebral disc degeneration, lumbosacral region: Secondary | ICD-10-CM

## 2016-01-22 DIAGNOSIS — M5416 Radiculopathy, lumbar region: Secondary | ICD-10-CM

## 2016-01-25 ENCOUNTER — Other Ambulatory Visit: Payer: Medicare HMO

## 2016-08-16 IMAGING — CT CT L SPINE W/ CM
3 of 8 series · 10 of 33 positions shown, 12 images · non-contrast
Comparison: CT myelogram 08/22/2014.  MRI lumbar spine 12/03/2014.

CLINICAL DATA: Continued low back and LEFT leg pain following L3-4
lateral fusion via retroperitoneal approach.
TECHNIQUE: Contiguous axial images were obtained through the Lumbar spine after
the intrathecal infusion of infusion. Coronal and sagittal
reconstructions were obtained of the axial image sets.

[Series 2: l spine soft · axial · 0.31mm/px · z∈[-230,-125]mm · 2 of 81 slices shown, 3 images]
[im 23/81  soft-tissue]
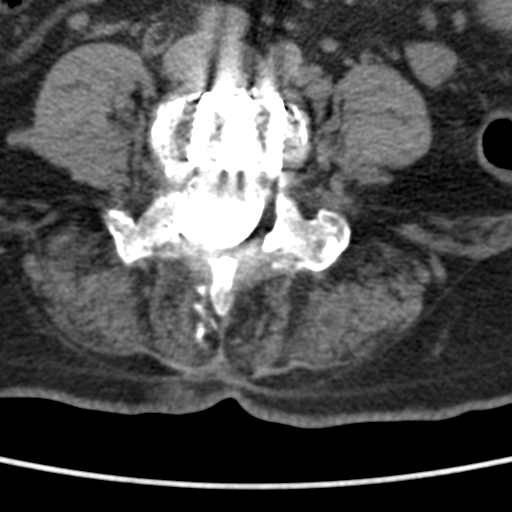
[im 23/81  bone]
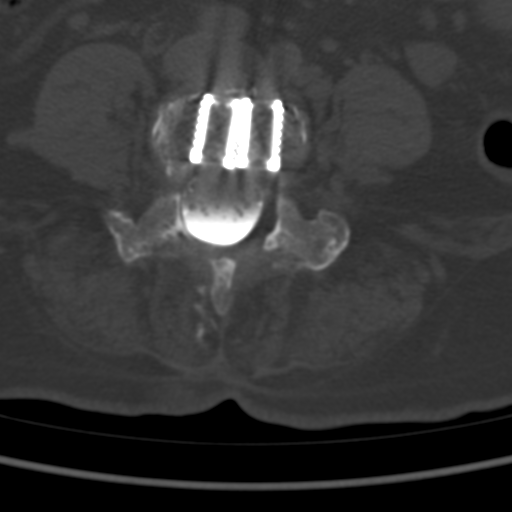
[im 58/81  bone]
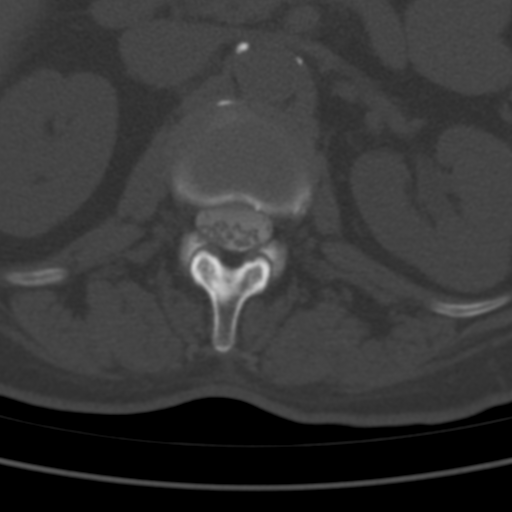

[Series 6: bone cor · coronal · 0.33mm/px · 3 of 42 slices shown]
[im 9/42  bone]
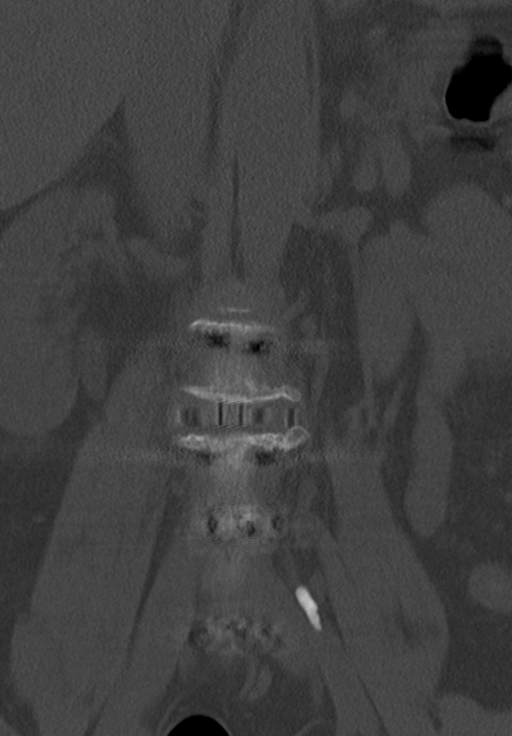
[im 17/42  bone]
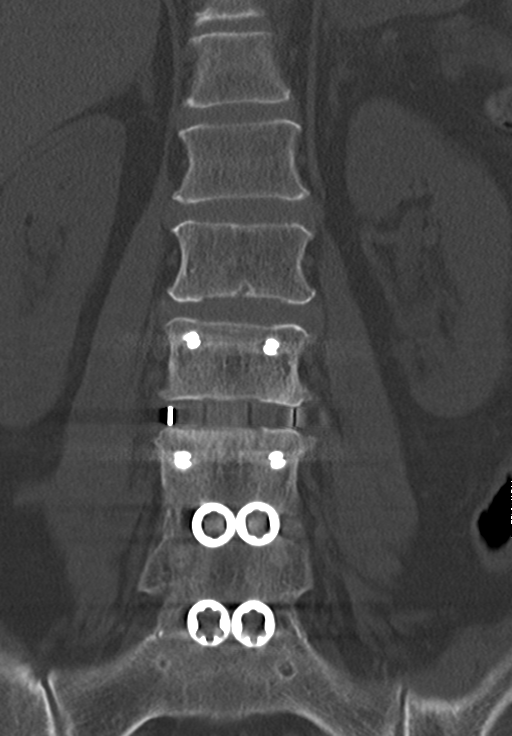
[im 25/42  bone]
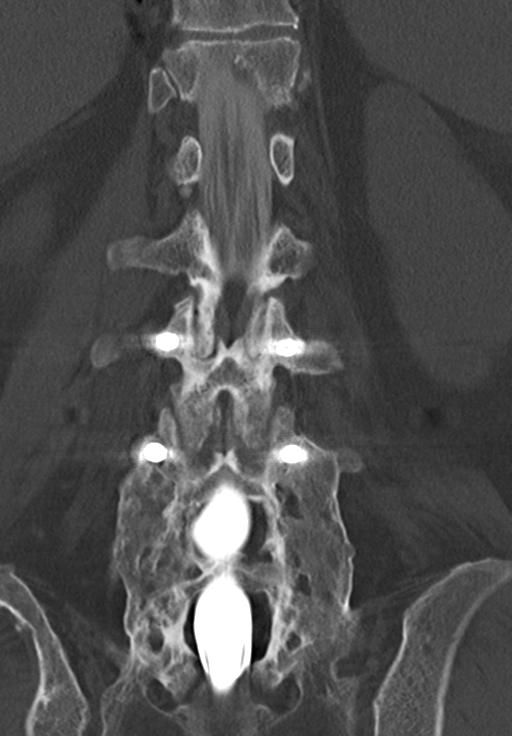

[Series 7: sag bone · sagittal · 0.27mm/px · 5 of 53 slices shown, 6 images]
[im 18/53  bone]
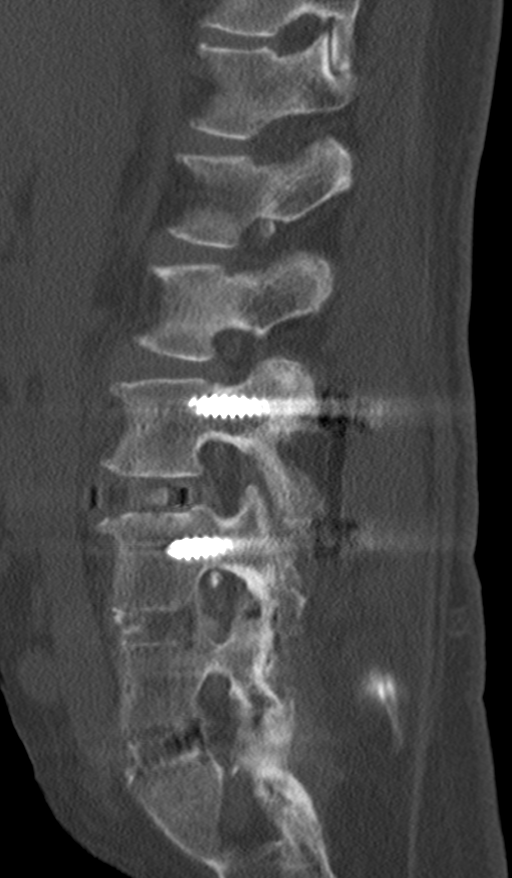
[im 22/53  bone]
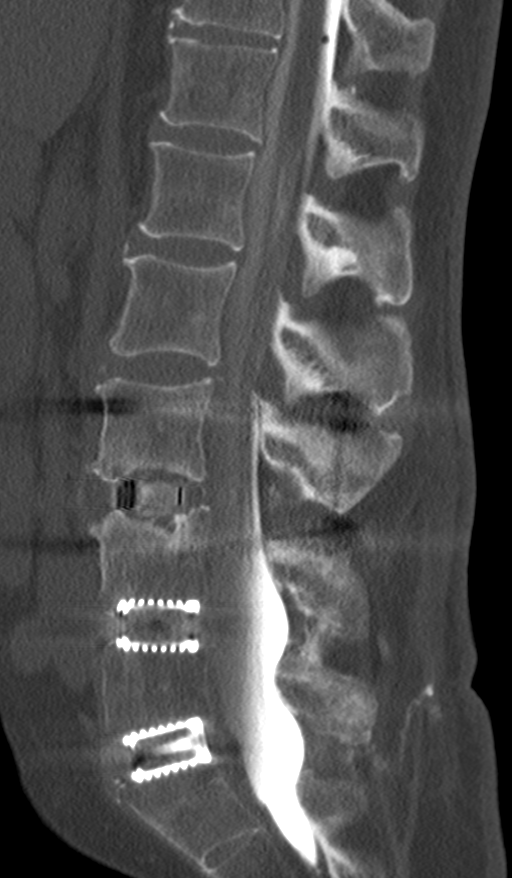
[im 27/53  soft-tissue]
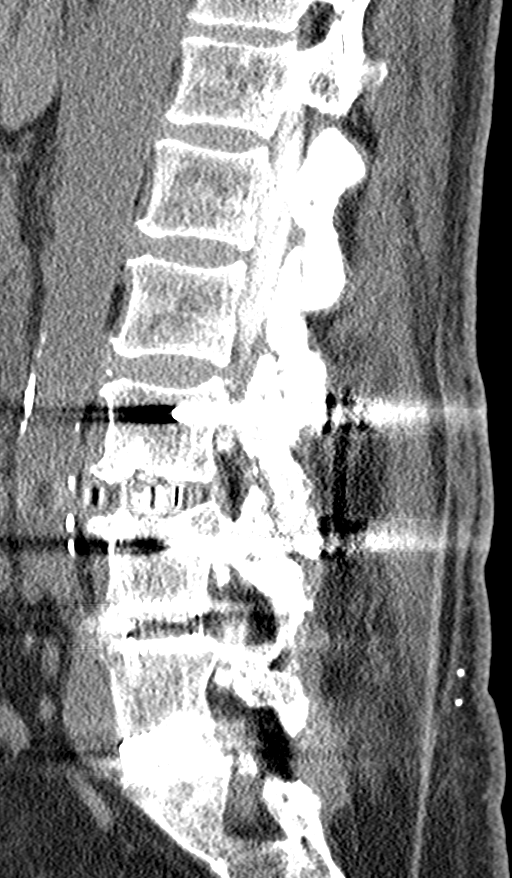
[im 27/53  bone]
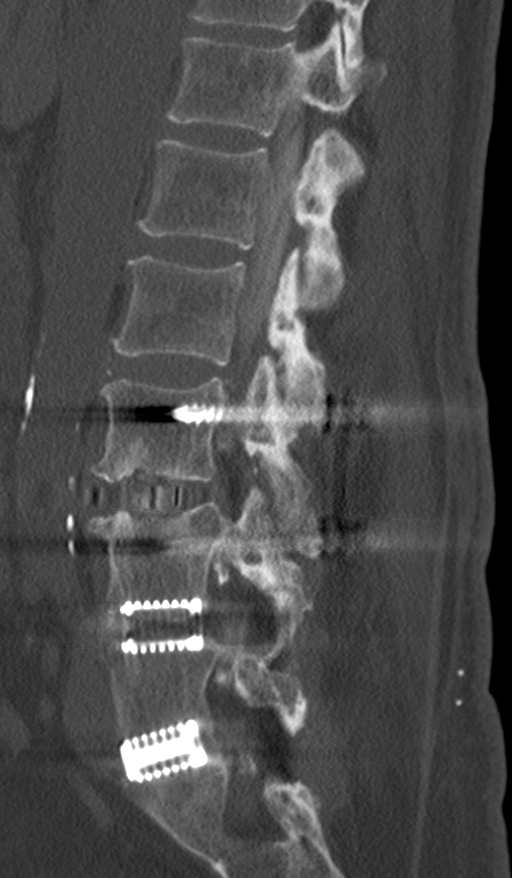
[im 31/53  bone]
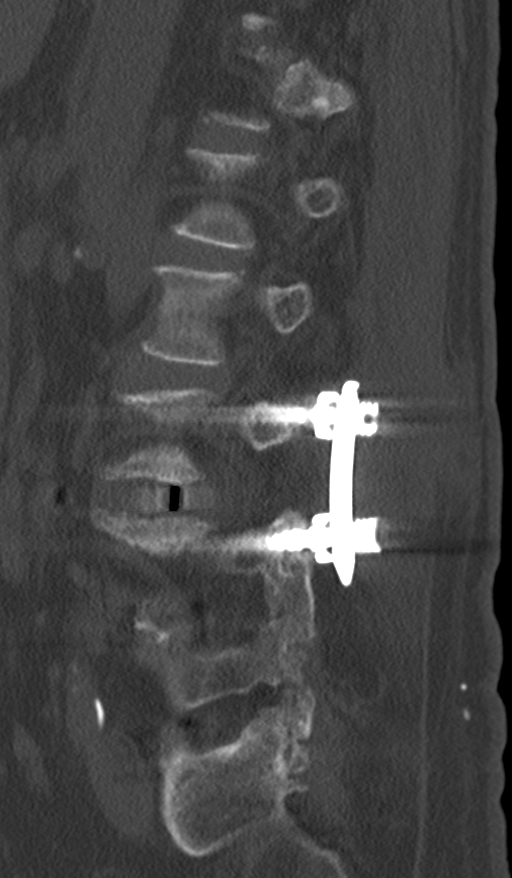
[im 35/53  bone]
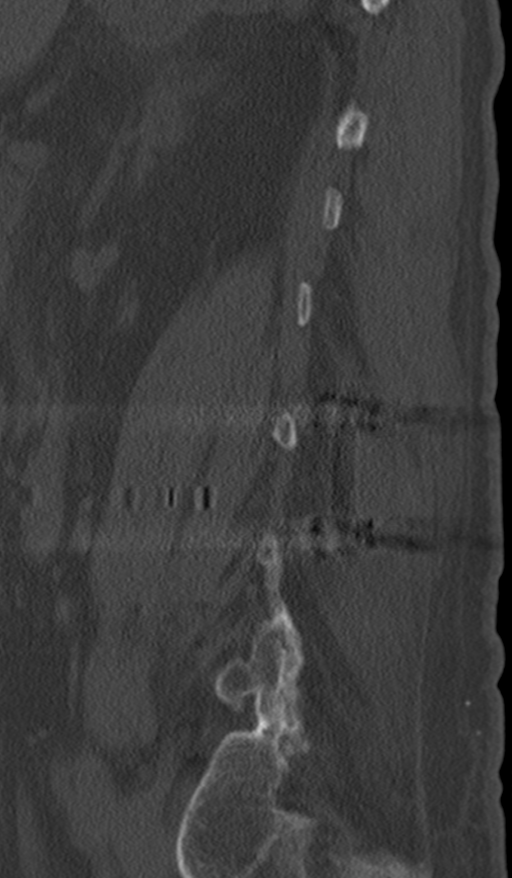

[10 of 33 positions shown; findings below may reference images not displayed]

EXAM:
LUMBAR MYELOGRAM

FLUOROSCOPY TIME:  45 seconds.  Eighteen spot films.

PROCEDURE:
After thorough discussion of risks and benefits of the procedure
including bleeding, infection, injury to nerves, blood vessels,
adjacent structures as well as headache and CSF leak, written and
oral informed consent was obtained. Consent was obtained by Dr. Dvorak
Caleb. Time out form was completed.

Patient was positioned prone on the fluoroscopy table. Local
anesthesia was provided with 1% lidocaine without epinephrine after
prepped and draped in the usual sterile fashion. Puncture was
performed at L4-5 using a 3 1/2 inch 22-gauge spinal needle via
midline approach. Using a single pass through the dura, the needle
was placed within the thecal sac, with return of clear CSF. 15 mL of
Isovue M 200 was injected into the thecal sac, with normal
opacification of the nerve roots and cauda equina consistent with
free flow within the subarachnoid space.

I personally performed the lumbar puncture and administered the
intrathecal contrast. I also personally supervised acquisition of
the myelogram images.
FINDINGS: LUMBAR MYELOGRAM FINDINGS:

Good opacification lumbar subarachnoid space. Previous ray cage
fusion at L4-5 and L5-S1 is solid. No impingement at those levels.

Previous myelography demonstrated adjacent segment disease at L3-4,
with stenosis and BILATERAL L4 nerve root impingement. Today's
examination shows significant improvement in the stenosis at this
level. Interbody cages are appropriately placed and there is no
significant ventral defect. Minimal subsidence of the cage into L4
superiorly. The alignment is anatomic except for trace
anterolisthesis L3-4. Posterior L3 and L4 pedicle screws are
appropriately positioned and intact without loosening.

With the patient standing, there is trace anterolisthesis at L3-4
better appreciated, but no dynamic instability.

CT LUMBAR MYELOGRAM FINDINGS:

Segmentation: Normal.

Alignment:  Normal.

Vertebrae: No worrisome osseous lesion.Solid arthrodesis L4 through
S1.

Conus medullaris: Normal in size, and location.

Paraspinal tissues: No evidence for hydronephrosis or paravertebral
mass.

Disc levels:

L1-L2: No posterior disc protrusion. Minimal annular calcification
and osseous spurring to the LEFT, noncompressive.

L2-L3: Mild stenosis. Mild annular bulging. Mild facet arthropathy
and ligamentum flavum hypertrophy. No impingement is observed.

L3-L4: Satisfactory appearance status post lateral interbody cage
placement via retroperitoneal approach and posterior pedicle screw
placement. Trace anterolisthesis of no significance. Minimal
subsidence of the cage into L4 but no posterior mass effect. No
subarticular zone or foraminal zone narrowing. Hardware intact and
appropriately placed. Small postsurgical defect in the LEFT psoas
muscle (image 48 series 2), but no psoas hematoma or retroperitoneal
hematoma. No inflammatory change along the path of the cage.

L4-L5:  Normal post fusion interspace.

L5-S1:  Normal post fusion interspace.

Faint visualization previous RIGHT iliac harvest site, unremarkable.
IMPRESSION: LUMBAR MYELOGRAM IMPRESSION:

Status post L3-4 fusion for adjacent segment disease.

No postsurgical complication, or residual stenosis.

No dynamic instability on standing flexion extension.

CT LUMBAR MYELOGRAM IMPRESSION:

Status post L4 through S1 Ray cage fusion. No residual impingement
at these levels.

Satisfactory appearance status post L3-4 lateral fusion via
retroperitoneal approach. Good relief of previously identified
adjacent segment disease, without postsurgical complication.

## 2016-08-16 IMAGING — RF DG MYELOGRAPHY LUMBAR INJ LUMBOSACRAL
13 of 15 series · 13 of 15 positions shown · non-contrast
Comparison: CT myelogram 08/22/2014.  MRI lumbar spine 12/03/2014.

CLINICAL DATA: Continued low back and LEFT leg pain following L3-4
lateral fusion via retroperitoneal approach.
TECHNIQUE: Contiguous axial images were obtained through the Lumbar spine after
the intrathecal infusion of infusion. Coronal and sagittal
reconstructions were obtained of the axial image sets.

[Series 1: myelogram  white · 1 of 1 slices shown (1 of 12)]
[im 1/1]
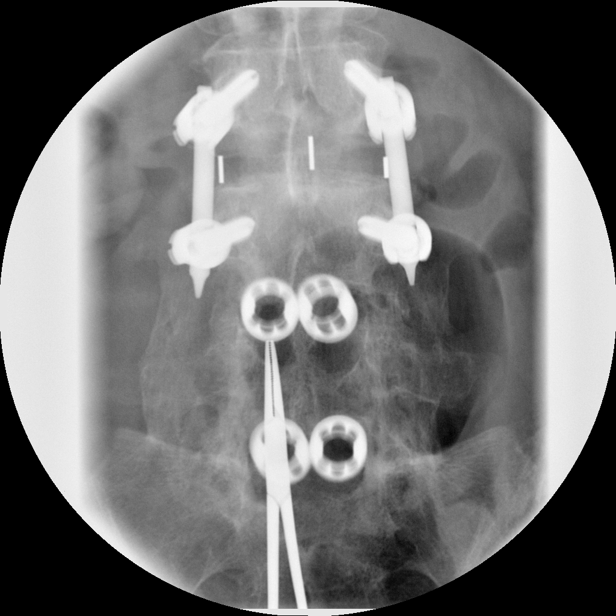

[Series 2: (hospital) · 1 of 1 slices shown]
[im 1/1]
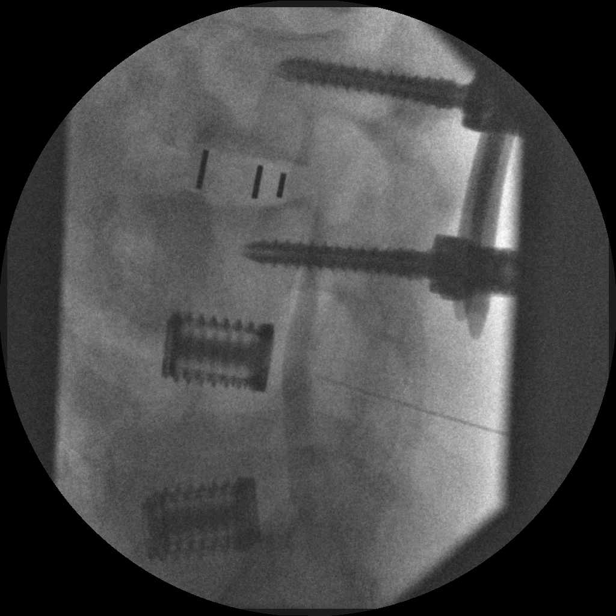

[Series 4: myelogram  white · 1 of 1 slices shown (2 of 12)]
[im 1/1]
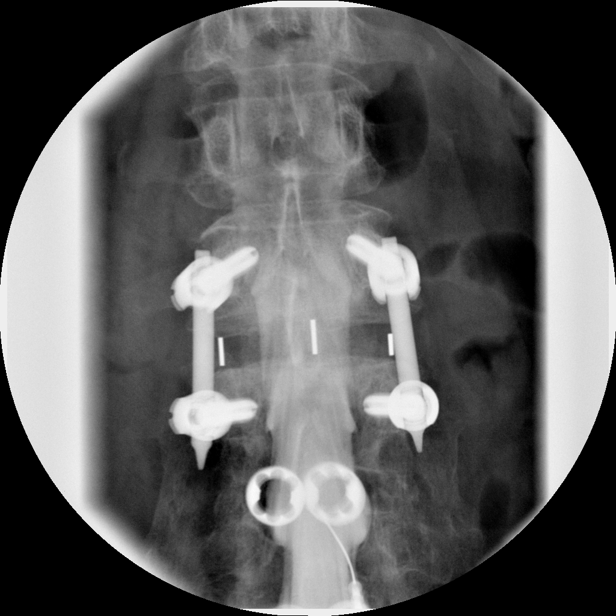

[Series 6: myelogram  white · 1 of 1 slices shown (3 of 12)]
[im 1/1]
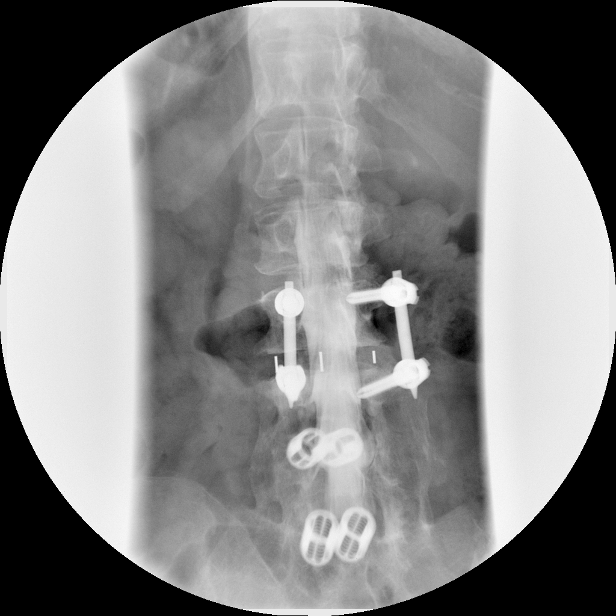

[Series 8: myelogram  white · 1 of 1 slices shown (4 of 12)]
[im 1/1]
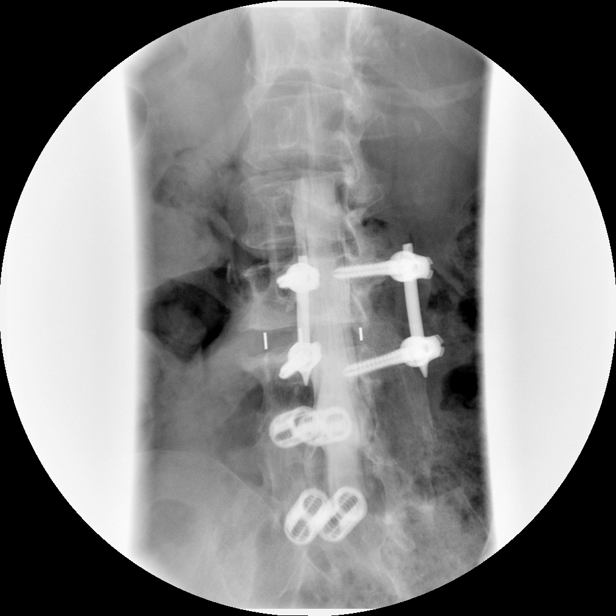

[Series 9: myelogram  white · 1 of 1 slices shown (5 of 12)]
[im 1/1]
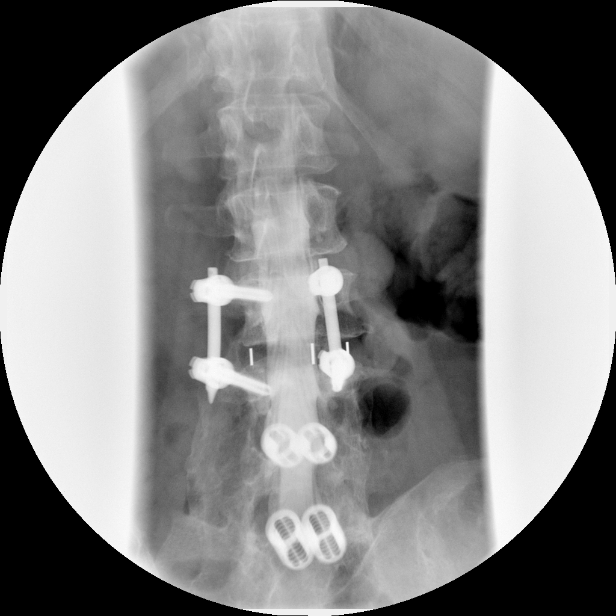

[Series 10: myelogram  white · 1 of 1 slices shown (6 of 12)]
[im 1/1]
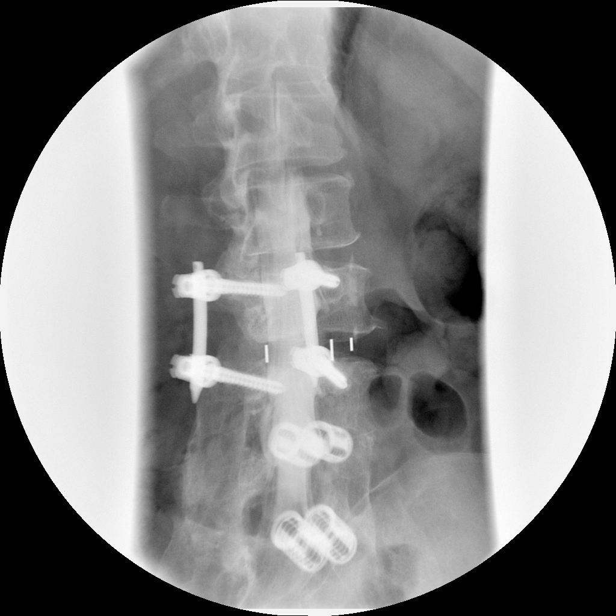

[Series 12: myelogram  white · 1 of 1 slices shown (7 of 12)]
[im 1/1]
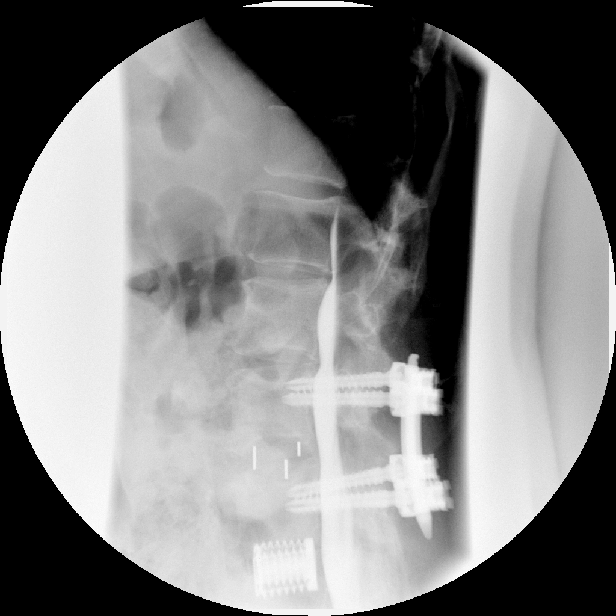

[Series 13: myelogram  white · 1 of 1 slices shown (8 of 12)]
[im 1/1]
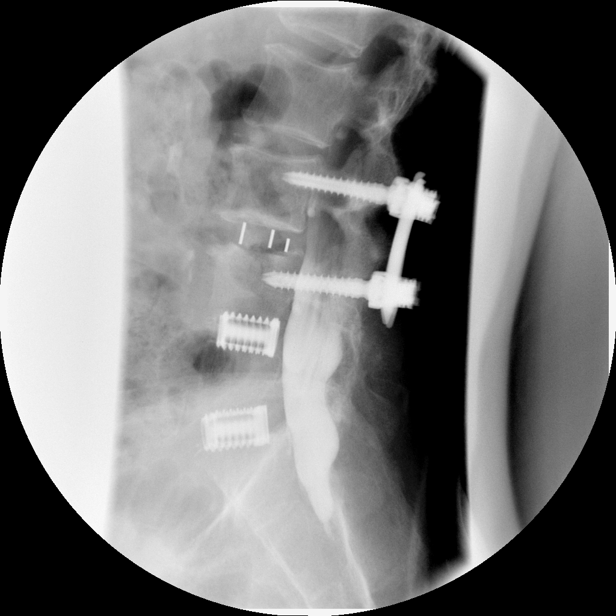

[Series 14: myelogram  white · 1 of 1 slices shown (9 of 12)]
[im 1/1]
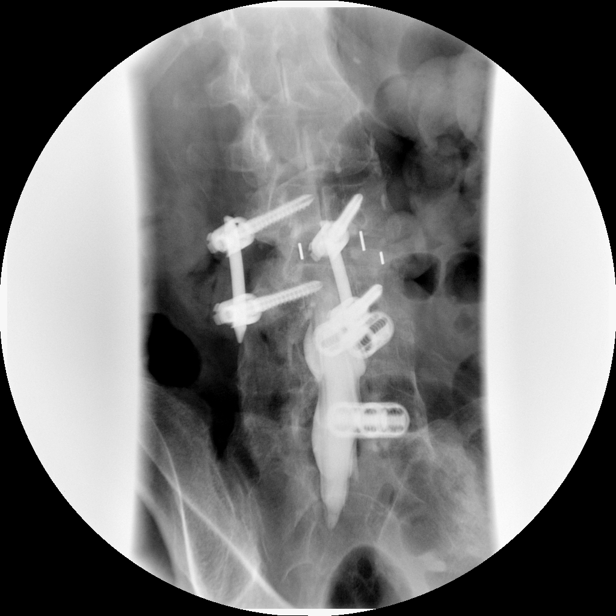

[Series 16: myelogram  white · 1 of 1 slices shown (10 of 12)]
[im 1/1]
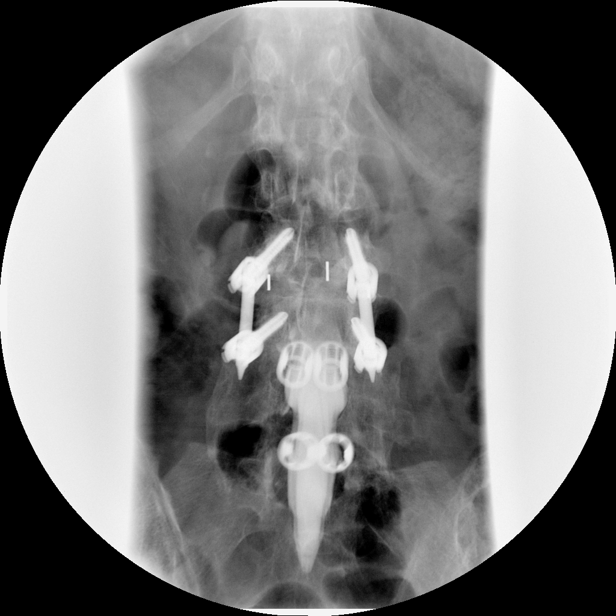

[Series 17: myelogram  white · 1 of 1 slices shown (11 of 12)]
[im 1/1]
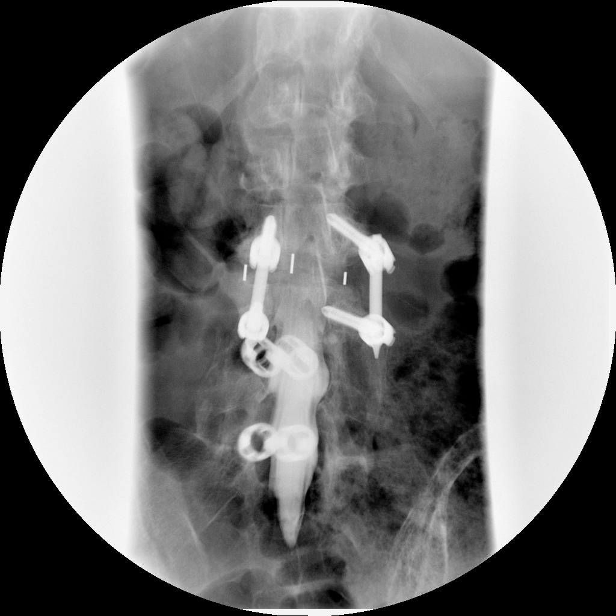

[Series 18: myelogram  white · 1 of 1 slices shown (12 of 12)]
[im 1/1]
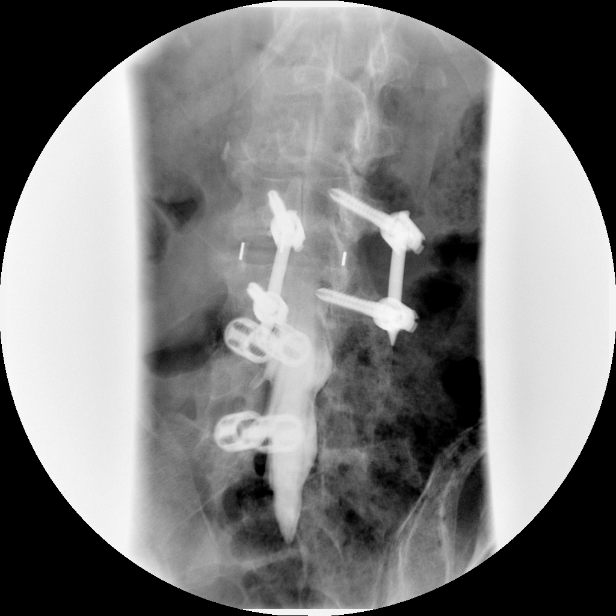

[13 of 15 positions shown; findings below may reference images not displayed]

EXAM:
LUMBAR MYELOGRAM

FLUOROSCOPY TIME:  45 seconds.  Eighteen spot films.

PROCEDURE:
After thorough discussion of risks and benefits of the procedure
including bleeding, infection, injury to nerves, blood vessels,
adjacent structures as well as headache and CSF leak, written and
oral informed consent was obtained. Consent was obtained by Dr. Dvorak
Caleb. Time out form was completed.

Patient was positioned prone on the fluoroscopy table. Local
anesthesia was provided with 1% lidocaine without epinephrine after
prepped and draped in the usual sterile fashion. Puncture was
performed at L4-5 using a 3 1/2 inch 22-gauge spinal needle via
midline approach. Using a single pass through the dura, the needle
was placed within the thecal sac, with return of clear CSF. 15 mL of
Isovue M 200 was injected into the thecal sac, with normal
opacification of the nerve roots and cauda equina consistent with
free flow within the subarachnoid space.

I personally performed the lumbar puncture and administered the
intrathecal contrast. I also personally supervised acquisition of
the myelogram images.
FINDINGS: LUMBAR MYELOGRAM FINDINGS:

Good opacification lumbar subarachnoid space. Previous ray cage
fusion at L4-5 and L5-S1 is solid. No impingement at those levels.

Previous myelography demonstrated adjacent segment disease at L3-4,
with stenosis and BILATERAL L4 nerve root impingement. Today's
examination shows significant improvement in the stenosis at this
level. Interbody cages are appropriately placed and there is no
significant ventral defect. Minimal subsidence of the cage into L4
superiorly. The alignment is anatomic except for trace
anterolisthesis L3-4. Posterior L3 and L4 pedicle screws are
appropriately positioned and intact without loosening.

With the patient standing, there is trace anterolisthesis at L3-4
better appreciated, but no dynamic instability.

CT LUMBAR MYELOGRAM FINDINGS:

Segmentation: Normal.

Alignment:  Normal.

Vertebrae: No worrisome osseous lesion.Solid arthrodesis L4 through
S1.

Conus medullaris: Normal in size, and location.

Paraspinal tissues: No evidence for hydronephrosis or paravertebral
mass.

Disc levels:

L1-L2: No posterior disc protrusion. Minimal annular calcification
and osseous spurring to the LEFT, noncompressive.

L2-L3: Mild stenosis. Mild annular bulging. Mild facet arthropathy
and ligamentum flavum hypertrophy. No impingement is observed.

L3-L4: Satisfactory appearance status post lateral interbody cage
placement via retroperitoneal approach and posterior pedicle screw
placement. Trace anterolisthesis of no significance. Minimal
subsidence of the cage into L4 but no posterior mass effect. No
subarticular zone or foraminal zone narrowing. Hardware intact and
appropriately placed. Small postsurgical defect in the LEFT psoas
muscle (image 48 series 2), but no psoas hematoma or retroperitoneal
hematoma. No inflammatory change along the path of the cage.

L4-L5:  Normal post fusion interspace.

L5-S1:  Normal post fusion interspace.

Faint visualization previous RIGHT iliac harvest site, unremarkable.
IMPRESSION: LUMBAR MYELOGRAM IMPRESSION:

Status post L3-4 fusion for adjacent segment disease.

No postsurgical complication, or residual stenosis.

No dynamic instability on standing flexion extension.

CT LUMBAR MYELOGRAM IMPRESSION:

Status post L4 through S1 Ray cage fusion. No residual impingement
at these levels.

Satisfactory appearance status post L3-4 lateral fusion via
retroperitoneal approach. Good relief of previously identified
adjacent segment disease, without postsurgical complication.

## 2017-03-30 ENCOUNTER — Other Ambulatory Visit: Payer: Self-pay | Admitting: Family Medicine

## 2017-03-30 DIAGNOSIS — M5137 Other intervertebral disc degeneration, lumbosacral region: Secondary | ICD-10-CM

## 2017-04-07 ENCOUNTER — Ambulatory Visit
Admission: RE | Admit: 2017-04-07 | Discharge: 2017-04-07 | Disposition: A | Discharge status: Home / Self Care | Payer: Medicare Other | Source: Ambulatory Visit | Attending: Family Medicine | Admitting: Family Medicine

## 2017-04-07 DIAGNOSIS — M5137 Other intervertebral disc degeneration, lumbosacral region: Secondary | ICD-10-CM

## 2017-04-07 MED ORDER — DIAZEPAM 5 MG PO TABS
5.0000 mg | ORAL_TABLET | Freq: Once | ORAL | Status: AC
  Administered 2017-04-07: 5 mg via ORAL

## 2017-04-07 MED ORDER — IOPAMIDOL (ISOVUE-M 200) INJECTION 41%
1.0000 mL | Freq: Once | INTRAMUSCULAR | Status: AC
  Administered 2017-04-07: 1 mL via INTRA_ARTICULAR

## 2017-04-07 MED ORDER — METHYLPREDNISOLONE ACETATE 40 MG/ML INJ SUSP (RADIOLOG
120.0000 mg | Freq: Once | INTRAMUSCULAR | Status: AC
  Administered 2017-04-07: 120 mg via INTRA_ARTICULAR

## 2017-04-07 NOTE — Discharge Instructions (Signed)

## 2017-06-29 ENCOUNTER — Other Ambulatory Visit: Payer: Self-pay | Admitting: Family Medicine

## 2017-06-29 DIAGNOSIS — M5416 Radiculopathy, lumbar region: Secondary | ICD-10-CM

## 2017-06-30 ENCOUNTER — Other Ambulatory Visit: Payer: Self-pay | Admitting: Family Medicine

## 2017-06-30 DIAGNOSIS — M5416 Radiculopathy, lumbar region: Secondary | ICD-10-CM

## 2017-06-30 DIAGNOSIS — M5137 Other intervertebral disc degeneration, lumbosacral region: Secondary | ICD-10-CM

## 2017-07-18 ENCOUNTER — Ambulatory Visit
Admission: RE | Admit: 2017-07-18 | Discharge: 2017-07-18 | Disposition: A | Discharge status: Home / Self Care | Payer: Medicare Other | Source: Ambulatory Visit | Attending: Family Medicine | Admitting: Family Medicine

## 2017-07-18 DIAGNOSIS — M5416 Radiculopathy, lumbar region: Secondary | ICD-10-CM

## 2017-07-18 DIAGNOSIS — M5137 Other intervertebral disc degeneration, lumbosacral region: Secondary | ICD-10-CM

## 2017-07-18 MED ORDER — DIAZEPAM 5 MG PO TABS
5.0000 mg | ORAL_TABLET | Freq: Once | ORAL | Status: AC
  Administered 2017-07-18: 5 mg via ORAL

## 2017-07-18 MED ORDER — IOPAMIDOL (ISOVUE-M 200) INJECTION 41%
1.0000 mL | Freq: Once | INTRAMUSCULAR | Status: AC
  Administered 2017-07-18: 1 mL via EPIDURAL

## 2017-07-18 MED ORDER — METHYLPREDNISOLONE ACETATE 40 MG/ML INJ SUSP (RADIOLOG
120.0000 mg | Freq: Once | INTRAMUSCULAR | Status: AC
  Administered 2017-07-18: 120 mg via EPIDURAL

## 2017-07-18 NOTE — Discharge Instructions (Signed)
